# Patient Record
Sex: Female | Born: 1970 | Race: White | Hispanic: Yes | Marital: Single | Smoking: Never smoker
Health system: Southern US, Community
[De-identification: ages and names within clinical notes are randomized; demographics above are authoritative.]

## PROBLEM LIST (undated history)

## (undated) DIAGNOSIS — I10 Essential (primary) hypertension: Secondary | ICD-10-CM

---

## 2012-12-28 ENCOUNTER — Emergency Department: Payer: Self-pay | Admitting: Emergency Medicine

## 2012-12-28 LAB — URINALYSIS, COMPLETE
Bacteria: NONE SEEN
Bilirubin,UR: NEGATIVE
Glucose,UR: NEGATIVE mg/dL (ref 0–75)
Ketone: NEGATIVE
Leukocyte Esterase: NEGATIVE
Nitrite: NEGATIVE
Ph: 5 (ref 4.5–8.0)
Protein: NEGATIVE
RBC,UR: 2 /HPF (ref 0–5)
Specific Gravity: 1.019 (ref 1.003–1.030)
Squamous Epithelial: 12
WBC UR: 1 /HPF (ref 0–5)

## 2014-03-17 ENCOUNTER — Emergency Department: Payer: Self-pay | Admitting: Emergency Medicine

## 2014-03-18 LAB — COMPREHENSIVE METABOLIC PANEL
ALT: 28 U/L
AST: 29 U/L (ref 15–37)
Albumin: 3.7 g/dL (ref 3.4–5.0)
Alkaline Phosphatase: 83 U/L
Anion Gap: 6 — ABNORMAL LOW (ref 7–16)
BUN: 19 mg/dL — ABNORMAL HIGH (ref 7–18)
Bilirubin,Total: 0.3 mg/dL (ref 0.2–1.0)
Calcium, Total: 8.9 mg/dL (ref 8.5–10.1)
Chloride: 99 mmol/L (ref 98–107)
Co2: 34 mmol/L — ABNORMAL HIGH (ref 21–32)
Creatinine: 0.82 mg/dL (ref 0.60–1.30)
EGFR (Non-African Amer.): >60
GLUCOSE: 116 mg/dL — AB (ref 65–99)
OSMOLALITY: 281 (ref 275–301)
POTASSIUM: 3.2 mmol/L — AB (ref 3.5–5.1)
Sodium: 139 mmol/L (ref 136–145)
TOTAL PROTEIN: 7.9 g/dL (ref 6.4–8.2)

## 2014-03-18 LAB — CBC WITH DIFFERENTIAL/PLATELET
BASOS ABS: 0.1 10*3/uL (ref 0.0–0.1)
Basophil %: 0.5 %
EOS PCT: 1.6 %
Eosinophil #: 0.2 10*3/uL (ref 0.0–0.7)
HCT: 38 % (ref 35.0–47.0)
HGB: 12.8 g/dL (ref 12.0–16.0)
Lymphocyte #: 3.7 10*3/uL — ABNORMAL HIGH (ref 1.0–3.6)
Lymphocyte %: 31.7 %
MCH: 29.3 pg (ref 26.0–34.0)
MCHC: 33.8 g/dL (ref 32.0–36.0)
MCV: 87 fL (ref 80–100)
Monocyte #: 1.1 x10 3/mm — ABNORMAL HIGH (ref 0.2–0.9)
Monocyte %: 9.1 %
Neutrophil #: 6.7 10*3/uL — ABNORMAL HIGH (ref 1.4–6.5)
Neutrophil %: 57.1 %
Platelet: 301 10*3/uL (ref 150–440)
RBC: 4.38 10*6/uL (ref 3.80–5.20)
RDW: 13.1 % (ref 11.5–14.5)
WBC: 11.8 10*3/uL — ABNORMAL HIGH (ref 3.6–11.0)

## 2014-03-18 LAB — URINALYSIS, COMPLETE
BILIRUBIN, UR: NEGATIVE
Glucose,UR: NEGATIVE mg/dL (ref 0–75)
Ketone: NEGATIVE
Nitrite: NEGATIVE
PROTEIN: NEGATIVE
Ph: 5 (ref 4.5–8.0)
RBC,UR: 2 /HPF (ref 0–5)
Specific Gravity: 1.02 (ref 1.003–1.030)
Squamous Epithelial: 6
WBC UR: 5 /HPF (ref 0–5)

## 2014-03-18 LAB — TROPONIN I: Troponin-I: <0.02 ng/mL

## 2014-04-10 ENCOUNTER — Emergency Department: Payer: Self-pay | Admitting: Emergency Medicine

## 2014-07-25 ENCOUNTER — Emergency Department
Admit: 2014-07-25 | Disposition: A | Discharge status: Home / Self Care | Payer: Self-pay | Admitting: Emergency Medicine

## 2014-08-04 ENCOUNTER — Emergency Department
Admission: EM | Admit: 2014-08-04 | Discharge: 2014-08-04 | Disposition: A | Discharge status: Home / Self Care | Payer: BLUE CROSS/BLUE SHIELD | Attending: Emergency Medicine | Admitting: Emergency Medicine

## 2014-08-04 DIAGNOSIS — Z4802 Encounter for removal of sutures: Secondary | ICD-10-CM | POA: Diagnosis present

## 2014-08-04 NOTE — ED Provider Notes (Signed)
[facility] Emergency Department Provider Note  ____________________________________________  Time seen: Approximately 3:40 PM  I have reviewed the triage vital signs and the nursing notes.   HISTORY  Chief Complaint Suture / Staple Removal  HPI Ashlee Griffin is a 44 y.o. female returns for suture removal. She has staples in her scalp that in there for 10 days. She denies any difficulty with these. There has been no pain, no fever, no drainage.   History reviewed. No pertinent past medical history.  There are no active problems to display for this patient.   Past Surgical History  Procedure Laterality Date  . Cesarean section      No current outpatient prescriptions on file.  Allergies Review of patient's allergies indicates not on file.  No family history on file.  Social History History  Substance Use Topics  . Smoking status: Never Smoker   . Smokeless tobacco: Not on file  . Alcohol Use: No    Review of Systems Constitutional: No fever/chills .ENT: No sore throat. Respiratory: Denies shortness of breath. Musculoskeletal: Negative for back pain. Skin: Negative for rash. No area of infection Neurological: Negative for headaches, focal weakness or numbness.    ____________________________________________   PHYSICAL EXAM:  VITAL SIGNS: ED Triage Vitals  Enc Vitals Group     BP 08/04/14 1208 159/87 mmHg     Pulse Rate 08/04/14 1208 79     Resp 08/04/14 1208 14     Temp 08/04/14 1208 98.8 F (37.1 C)     Temp Source 08/04/14 1208 Oral     SpO2 08/04/14 1208 99 %     Weight 08/04/14 1212 137 lb (62.143 kg)     Height 08/04/14 1212 5\' 1"  (1.549 m)     Head Cir --      Peak Flow --      Pain Score 08/04/14 1208 0     Pain Loc --      Pain Edu? --      Excl. in GC? --     Constitutional: Alert and oriented. Well appearing and in no acute distress. Eyes: Conjunctivae are normal. PERRL. EOMI. Head: Atraumatic. Nose: No  congestion/rhinnorhea. Neck: No stridor.   Cardiovascular: Normal rate, regular rhythm. Grossly normal heart sounds.  Good peripheral circulation. Respiratory: Normal respiratory effort.  No retractions. Lungs CTAB. Gastrointestinal: Soft and nontender. No distention. No abdominal bruits. No CVA tenderness. Musculoskeletal: No lower extremity tenderness nor edema.  No joint effusions. Neurologic:  Normal speech and language. No gross focal neurologic deficits are appreciated. Speech is normal. No gait instability. Skin:  Skin is warm, dry and intact. Staples area appears to have healed well and there is no signs of infection or redness. Psychiatric: Mood and affect are normal. Speech and behavior are normal.  ____________________________________________   LABS (all labs ordered are listed, but only abnormal results are displayed)  Labs Reviewed - No data to display ____________________________________________  EKG  No____________________________________________  RADIOLOGY  No ____________________________________________   PROCEDURES  Procedure(s) performed: Staples were removed by the RN without any difficulty  Critical Care performed: No  ____________________________________________   INITIAL IMPRESSION / ASSESSMENT AND PLAN / ED COURSE  Pertinent labs & imaging results that were available during my care of the patient were reviewed by me and considered in my medical decision making (see chart for details).  Staples were removed patient was told to return if any problems ____________________________________________   FINAL CLINICAL IMPRESSION(S) / ED DIAGNOSES  Final diagnoses:  Removal of staples      Tommi RumpsRhonda L Summers, PA-C 08/04/14 1545  Myrna Blazeravid Matthew Schaevitz, MD 08/05/14 [phone]

## 2014-08-04 NOTE — ED Notes (Signed)
Pt here for suture removal, denies having any problems. No drainage visible.

## 2014-08-04 NOTE — ED Notes (Signed)
Pt reports to ED w/ c/o staple removal. Pt states the staples were put in 10 days ago at [facility].  Pts staples are on the L side of head

## 2014-11-22 ENCOUNTER — Encounter: Payer: Self-pay | Admitting: Emergency Medicine

## 2014-11-22 ENCOUNTER — Emergency Department
Admission: EM | Admit: 2014-11-22 | Discharge: 2014-11-23 | Disposition: A | Discharge status: Home / Self Care | Payer: BLUE CROSS/BLUE SHIELD | Attending: Emergency Medicine | Admitting: Emergency Medicine

## 2014-11-22 DIAGNOSIS — I10 Essential (primary) hypertension: Secondary | ICD-10-CM | POA: Diagnosis not present

## 2014-11-22 DIAGNOSIS — M542 Cervicalgia: Secondary | ICD-10-CM

## 2014-11-22 HISTORY — DX: Essential (primary) hypertension: I10

## 2014-11-22 NOTE — ED Notes (Signed)
Pt. States around 8 pm this evening pt. States pain to back rt. Side of neck.  Pt. Denies traumatic injury.  Pt. Denies same symptoms in past.

## 2014-11-22 NOTE — ED Notes (Signed)
Pt c/o pain to the right side of her neck that started about 8pm tonight while she was at work; denies injury; did not take any medications for pain prior to arrival

## 2014-11-22 NOTE — ED Notes (Signed)
No swelling or redness to area.

## 2014-11-23 MED ORDER — IBUPROFEN 800 MG PO TABS
800.0000 mg | ORAL_TABLET | Freq: Once | ORAL | Status: AC
  Administered 2014-11-23: 800 mg via ORAL
  Filled 2014-11-23: qty 1

## 2014-11-23 MED ORDER — IBUPROFEN 800 MG PO TABS: 800.0000 mg | ORAL_TABLET | Freq: Three times a day (TID) | ORAL | Status: DC | PRN

## 2014-11-23 MED ORDER — FLUORESCEIN SODIUM 1 MG OP STRP
1.0000 | ORAL_STRIP | Freq: Once | OPHTHALMIC | Status: AC
Start: 2014-11-23 — End: 2014-11-23
  Administered 2014-11-23: 1 via OPHTHALMIC
  Filled 2014-11-23: qty 1

## 2014-11-23 NOTE — Discharge Instructions (Signed)
Please return for worse pain or any new symptoms. Try motrin 800 mg 1 pill 3 x a day for the pain.

## 2014-11-23 NOTE — ED Provider Notes (Signed)
[facility] Emergency Department Provider Note  ____________________________________________  Time seen: Approximately 12:37 AM  I have reviewed the triage vital signs and the nursing notes.   HISTORY  Chief Complaint Neck Pain    HPI Ashlee Griffin is a 44 y.o. 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 Reports is not severe. It is achy. Nothing really seems to make it better or worse and does not radiate anywhere she reports she had some twitching in her eye which has since resolved she rubbed her eye became red.   Past Medical History  Diagnosis Date  . Hypertension     There are no active problems to display for this patient.   Past Surgical History  Procedure Laterality Date  . Cesarean section      Current Outpatient Rx  Name  Route  Sig  Dispense  Refill  . ibuprofen (ADVIL,MOTRIN) 800 MG tablet   Oral   Take 1 tablet (800 mg total) by mouth every 8 (eight) hours as needed.   30 tablet   0     Allergies Review of patient's allergies indicates no known allergies.  History reviewed. No pertinent family history.  Social History Social History  Substance Use Topics  . Smoking status: Never Smoker   . Smokeless tobacco: None  . Alcohol Use: No    Review of Systems Constitutional: No fever/chills Eyes: No visual changes. ENT: No sore throat. Cardiovascular: Denies chest pain. Respiratory: Denies shortness of breath. Gastrointestinal: No abdominal pain.  No nausea, no vomiting.  No diarrhea.  No constipation. Genitourinary: Negative for dysuria. Musculoskeletal: Negative for back pain. Skin: Negative for rash. Neurological: Negative for headaches, focal weakness or numbness.  10-point ROS otherwise negative.  ____________________________________________   PHYSICAL EXAM:  VITAL SIGNS: ED Triage Vitals  Enc Vitals Group     BP 11/22/14 2249 163/97 mmHg     Pulse  Rate 11/22/14 2249 77     Resp 11/22/14 2249 18     Temp 11/22/14 2249 98.8 F (37.1 C)     Temp Source 11/22/14 2249 Oral     SpO2 11/22/14 2249 100 %     Weight 11/22/14 2249 143 lb (64.864 kg)     Height --      Head Cir --      Peak Flow --      Pain Score 11/22/14 2249 8     Pain Loc --      Pain Edu? --      Excl. in GC? --     Constitutional: Alert and oriented. Well appearing and in no acute distress. Eyes: Right conjunctiva is somewhat injected pleurisy and is negative there is no discharge she reports she rubbed her eye is quite became red patient reports she rubbed her eye because twitching which is since resolved. PERRL. EOMI.  Head: Atraumatic. nose: No congestion/rhinnorhea. Mouth/Throat: Mucous membranes are moist.  Oropharynx non-erythematous. Neck: No stridor. No limitation to range of motion and no palpable nodes no muscle spasm no real tenderness on palpation either over the muscle No cervical spine tenderness to palpation. Cardiovascular: Normal rate, regular rhythm. Grossly normal heart sounds.  Good peripheral circulation. Respiratory: Normal respiratory effort.  No retractions. Lungs CTAB. Gastrointestinal: Soft and nontender. No distention. No abdominal bruits. No CVA tenderness. Musculoskeletal: No lower extremity tenderness nor edema.  No joint effusions. Neurologic:  Normal speech and language. No gross focal neurologic deficits  are appreciated. No gait instability. Skin:  Skin is warm, dry and intact. No rash noted. Psychiatric: Mood and affect are normal. Speech and behavior are normal.  ____________________________________________   LABS (all labs ordered are listed, but only abnormal results are displayed)  Labs Reviewed - No data to  display ____________________________________________  EKG   ____________________________________________  RADIOLOGY   ____________________________________________   PROCEDURES    ____________________________________________   INITIAL IMPRESSION / ASSESSMENT AND PLAN / ED COURSE  Pertinent labs & imaging results that were available during my care of the patient were reviewed by me and considered in my medical decision making (see chart for details).   ____________________________________________   FINAL CLINICAL IMPRESSION(S) / ED DIAGNOSES  Final diagnoses:  Neck pain      Arnaldo Natal, MD 11/23/14 [phone]

## 2015-01-16 ENCOUNTER — Emergency Department
Admission: EM | Admit: 2015-01-16 | Discharge: 2015-01-16 | Disposition: A | Discharge status: Home / Self Care | Payer: BLUE CROSS/BLUE SHIELD | Attending: Student | Admitting: Student

## 2015-01-16 ENCOUNTER — Other Ambulatory Visit: Payer: Self-pay

## 2015-01-16 ENCOUNTER — Emergency Department: Payer: BLUE CROSS/BLUE SHIELD

## 2015-01-16 ENCOUNTER — Encounter: Payer: Self-pay | Admitting: Emergency Medicine

## 2015-01-16 DIAGNOSIS — R079 Chest pain, unspecified: Secondary | ICD-10-CM | POA: Diagnosis present

## 2015-01-16 DIAGNOSIS — R1013 Epigastric pain: Secondary | ICD-10-CM | POA: Insufficient documentation

## 2015-01-16 DIAGNOSIS — R070 Pain in throat: Secondary | ICD-10-CM | POA: Diagnosis not present

## 2015-01-16 DIAGNOSIS — I10 Essential (primary) hypertension: Secondary | ICD-10-CM | POA: Insufficient documentation

## 2015-01-16 DIAGNOSIS — Z3202 Encounter for pregnancy test, result negative: Secondary | ICD-10-CM | POA: Diagnosis not present

## 2015-01-16 LAB — COMPREHENSIVE METABOLIC PANEL
ALBUMIN: 4.3 g/dL (ref 3.5–5.0)
ALK PHOS: 65 U/L (ref 38–126)
ALT: 22 U/L (ref 14–54)
AST: 22 U/L (ref 15–41)
Anion gap: 10 (ref 5–15)
BILIRUBIN TOTAL: 0.3 mg/dL (ref 0.3–1.2)
BUN: 11 mg/dL (ref 6–20)
CALCIUM: 9.7 mg/dL (ref 8.9–10.3)
CO2: 27 mmol/L (ref 22–32)
Chloride: 101 mmol/L (ref 101–111)
Creatinine, Ser: 0.72 mg/dL (ref 0.44–1.00)
GFR calc Af Amer: >60 mL/min (ref >60)
GLUCOSE: 119 mg/dL — AB (ref 65–99)
POTASSIUM: 3.6 mmol/L (ref 3.5–5.1)
Sodium: 138 mmol/L (ref 135–145)
TOTAL PROTEIN: 7.8 g/dL (ref 6.5–8.1)

## 2015-01-16 LAB — CBC
HEMATOCRIT: 36.7 % (ref 35.0–47.0)
HEMOGLOBIN: 12.3 g/dL (ref 12.0–16.0)
MCH: 28.6 pg (ref 26.0–34.0)
MCHC: 33.6 g/dL (ref 32.0–36.0)
MCV: 85.1 fL (ref 80.0–100.0)
Platelets: 321 10*3/uL (ref 150–440)
RBC: 4.32 MIL/uL (ref 3.80–5.20)
RDW: 13.2 % (ref 11.5–14.5)
WBC: 9.1 10*3/uL (ref 3.6–11.0)

## 2015-01-16 LAB — HCG, QUANTITATIVE, PREGNANCY: hCG, Beta Chain, Quant, S: 1 m[IU]/mL (ref <5)

## 2015-01-16 LAB — TROPONIN I

## 2015-01-16 MED ORDER — OMEPRAZOLE 20 MG PO CPDR
20.0000 mg | DELAYED_RELEASE_CAPSULE | Freq: Every day | ORAL | Status: AC
Start: 2015-01-16 — End: 2016-01-16

## 2015-01-16 MED ORDER — GI COCKTAIL ~~LOC~~
30.0000 mL | Freq: Once | ORAL | Status: AC
  Administered 2015-01-16: 30 mL via ORAL
  Filled 2015-01-16: qty 30

## 2015-01-16 MED ORDER — ACETAMINOPHEN 500 MG PO TABS
1000.0000 mg | ORAL_TABLET | Freq: Once | ORAL | Status: AC
Start: 2015-01-16 — End: 2015-01-16
  Administered 2015-01-16: 1000 mg via ORAL
  Filled 2015-01-16: qty 2

## 2015-01-16 NOTE — ED Provider Notes (Addendum)
[facility] Emergency Department Provider Note  ____________________________________________  Time seen: Approximately 3:35 PM  I have reviewed the triage vital signs and the nursing notes.   HISTORY  Chief Complaint Chest Pain  History of present illness not limited - she speaks english as well as Spanish and has declined use of interpreter.  HPI Ashlee Griffin is a 44 y.o. female with history of hypertension, no other chronic medical problems who presents for evaluation of nearly 7 hours constant sternal/epigastric pressure, gradual onset, constant since onset, radiating to the back. Not worsened with exertion or movement, not pleuritic in nature. She reports she took some ibuprofen with minimal improvement of her symptoms. Current symptoms are moderate in severity. She has never had pain like this before. She reports 1 week of burning in her throat but no fevers, chills. She has never had similar symptoms. No early family history of coronary artery disease, no personal or family history of PE or DVT.   Past Medical History  Diagnosis Date  . Hypertension     There are no active problems to display for this patient.   Past Surgical History  Procedure Laterality Date  . Cesarean section      Current Outpatient Rx  Name  Route  Sig  Dispense  Refill  . ibuprofen (ADVIL,MOTRIN) 800 MG tablet   Oral   Take 1 tablet (800 mg total) by mouth every 8 (eight) hours as needed.   30 tablet   0     Allergies Review of patient's allergies indicates no known allergies.  No family history on file.  Social History Social History  Substance Use Topics  . Smoking status: Never Smoker   . Smokeless tobacco: None  . Alcohol Use: No    Review of Systems Constitutional: No fever/chills Eyes: No visual changes. ENT: + burning in throat. Cardiovascular: + chest pain. Respiratory: Denies shortness of breath. Gastrointestinal: No abdominal pain.  No nausea,  no vomiting.  No diarrhea.  No constipation. Genitourinary: Negative for dysuria. Musculoskeletal: Negative for back pain. Skin: Negative for rash. Neurological: Negative for headaches, focal weakness or numbness.  10-point ROS otherwise negative.  ____________________________________________   PHYSICAL EXAM:  VITAL SIGNS: ED Triage Vitals  Enc Vitals Group     BP 01/16/15 1251 164/103 mmHg     Pulse Rate 01/16/15 1251 71     Resp 01/16/15 1251 18     Temp 01/16/15 1251 98.1 F (36.7 C)     Temp Source 01/16/15 1251 Oral     SpO2 01/16/15 1251 99 %     Weight 01/16/15 1251 146 lb (66.225 kg)     Height 01/16/15 1251 5' (1.524 m)     Head Cir --      Peak Flow --      Pain Score 01/16/15 1252 7     Pain Loc --      Pain Edu? --      Excl. in GC? --     Constitutional: Alert and oriented. Well appearing and in no acute distress. Eyes: Conjunctivae are normal. PERRL. EOMI. Head: Atraumatic. Nose: No congestion/rhinnorhea. Mouth/Throat: Mucous membranes are moist.  Oropharynx non-erythematous. Neck: No stridor.  Cardiovascular: Normal rate, regular rhythm. Grossly normal heart sounds.  Good peripheral circulation. Respiratory: Normal respiratory effort.  No retractions. Lungs CTAB. Gastrointestinal: Soft and nontender. No distention. No CVA tenderness. Genitourinary: deferred Musculoskeletal: No lower extremity tenderness nor edema.  No joint effusions. Neurologic:  Normal speech and language. No  gross focal neurologic deficits are appreciated. No gait instability. Skin:  Skin is warm, dry and intact. No rash noted. Psychiatric: Mood and affect are normal. Speech and behavior are normal.  ____________________________________________   LABS (all labs ordered are listed, but only abnormal results are displayed)  Labs Reviewed  COMPREHENSIVE METABOLIC PANEL - Abnormal; Notable for the following:    Glucose, Bld 119 (*)    All other components within normal limits   CBC  TROPONIN I  HCG, QUANTITATIVE, PREGNANCY   ____________________________________________  EKG  ED ECG REPORT I, Gayla DossGayle, Junell Cullifer A, the attending physician, personally viewed and interpreted this ECG.   Date: 01/16/2015  EKG Time: 12:48  Rate: 71  Rhythm: normal EKG, normal sinus rhythm  Axis: normal  Intervals:none  ST&T Change: No acute ST elevation  ____________________________________________  RADIOLOGY  CXR  IMPRESSION: No active disease.  ____________________________________________   PROCEDURES  Procedure(s) performed: None  Critical Care performed: No  ____________________________________________   INITIAL IMPRESSION / ASSESSMENT AND PLAN / ED COURSE  Pertinent labs & imaging results that were available during my care of the patient were reviewed by me and considered in my medical decision making (see chart for details).  Ashlee Griffin is a 44 y.o. female with history of hypertension, no other chronic medical problems who presents for evaluation of nearly 7 hours constant sternal/epigastric pressure, gradual onset, constant since onset, radiating to the back. On exam, she is very well-appearing and in no acute distress. Vital signs stable, she is afebrile. She has a benign examination. Labs reviewed. Normal CBC, CMP, negative troponin, she is not pregnant. Chest x-ray clear. EKG reassuring, not consistent with ischemia, troponin negative, pressure not exertional in nature, heart score 1, doubt ACS. PERC negative, doubt PE. Her symptoms have completely resolved after GI cocktail and given the burning in her throat I suspect she may be having symptoms of GERD. We'll start omeprazole. History and physical not consistent with acute aortic dissection. We discussed return precautions and need for close PCP follow-up and she is comfortable with the discharge plan. DC home. ____________________________________________   FINAL CLINICAL IMPRESSION(S) / ED  DIAGNOSES  Final diagnoses:  Chest pain, unspecified chest pain type      Gayla DossEryka A Annaclaire Walsworth, MD 01/16/15 1718  Gayla DossEryka A Spenser Cong, MD 01/16/15 [id number]

## 2015-01-16 NOTE — ED Notes (Signed)
Pt to ED with c/o midsternal cp that radiates through to upper mid back area, states pain started today while at work, denies any other symptoms, states she does however have a slight sore throat

## 2015-08-02 ENCOUNTER — Encounter: Payer: Self-pay | Admitting: Emergency Medicine

## 2015-08-02 ENCOUNTER — Emergency Department
Admission: EM | Admit: 2015-08-02 | Discharge: 2015-08-02 | Disposition: A | Discharge status: Home / Self Care | Payer: BLUE CROSS/BLUE SHIELD | Attending: Emergency Medicine | Admitting: Emergency Medicine

## 2015-08-02 ENCOUNTER — Emergency Department: Payer: BLUE CROSS/BLUE SHIELD

## 2015-08-02 DIAGNOSIS — H9202 Otalgia, left ear: Secondary | ICD-10-CM | POA: Diagnosis present

## 2015-08-02 DIAGNOSIS — J209 Acute bronchitis, unspecified: Secondary | ICD-10-CM | POA: Diagnosis not present

## 2015-08-02 DIAGNOSIS — I10 Essential (primary) hypertension: Secondary | ICD-10-CM | POA: Insufficient documentation

## 2015-08-02 DIAGNOSIS — H66001 Acute suppurative otitis media without spontaneous rupture of ear drum, right ear: Secondary | ICD-10-CM | POA: Insufficient documentation

## 2015-08-02 DIAGNOSIS — J40 Bronchitis, not specified as acute or chronic: Secondary | ICD-10-CM

## 2015-08-02 MED ORDER — ALBUTEROL SULFATE HFA 108 (90 BASE) MCG/ACT IN AERS: 2.0000 | INHALATION_SPRAY | Freq: Four times a day (QID) | RESPIRATORY_TRACT | Status: AC | PRN

## 2015-08-02 MED ORDER — AMOXICILLIN 875 MG PO TABS: 875.0000 mg | ORAL_TABLET | Freq: Two times a day (BID) | ORAL | Status: AC

## 2015-08-02 MED ORDER — PREDNISONE 20 MG PO TABS: 60.0000 mg | ORAL_TABLET | Freq: Every day | ORAL | Status: DC

## 2015-08-02 MED ORDER — IPRATROPIUM-ALBUTEROL 0.5-2.5 (3) MG/3ML IN SOLN
3.0000 mL | Freq: Once | RESPIRATORY_TRACT | Status: AC
  Administered 2015-08-02: 3 mL via RESPIRATORY_TRACT
  Filled 2015-08-02: qty 3

## 2015-08-02 NOTE — ED Notes (Signed)
Pt alert and oriented X4, active, cooperative, pt in NAD. RR even and unlabored, color WNL.  Pt informed to return if any life threatening symptoms occur.  RR even, unlabored, color WNL, pt in NAD.

## 2015-08-02 NOTE — ED Provider Notes (Signed)
[facility] Emergency Department Provider Note   ____________________________________________  Time seen: Approximately 6:33 AM  I have reviewed the triage vital signs and the nursing notes.   HISTORY  Chief Complaint Cough and Otalgia    HPI Ashlee Griffin is a 45 y.o. female comes into the hospital today with a cough and ear pain. The patient reports that she's had a cough for the past 2 weeks. It is dry and sound and she has been taking NyQuil. She reports that she's taken some other medicines to but she is unsure of the names. She reports that the cough is still bothering her. She denies any shortness of breath and reports that her temperature was in the 90s. The patient reports that her boyfriend has been sick. She has some mild left ear pain and feels as though she is unable to hear. Her throat hurts as well. The patient rates her pain a 6-7/10 in intensity. The patient denies any nausea or vomiting denies any blurred vision. She has been eating and drinking well without any difficulty. The patient reports that she does have a headache though. She is here to get her symptoms checked out. According to the triage note the patient has a right-sided earache.   Past Medical History  Diagnosis Date  . Hypertension     There are no active problems to display for this patient.   Past Surgical History  Procedure Laterality Date  . Cesarean section      Current Outpatient Rx  Name  Route  Sig  Dispense  Refill  . albuterol (PROVENTIL HFA;VENTOLIN HFA) 108 (90 Base) MCG/ACT inhaler   Inhalation   Inhale 2 puffs into the lungs every 6 (six) hours as needed.   1 Inhaler   0   . amoxicillin (AMOXIL) 875 MG tablet   Oral   Take 1 tablet (875 mg total) by mouth 2 (two) times daily.   10 tablet   0   . ibuprofen (ADVIL,MOTRIN) 800 MG tablet   Oral   Take 1 tablet (800 mg total) by mouth every 8 (eight) hours as needed.   30 tablet   0   .  omeprazole (PRILOSEC) 20 MG capsule   Oral   Take 1 capsule (20 mg total) by mouth daily.   30 capsule   0   . predniSONE (DELTASONE) 20 MG tablet   Oral   Take 3 tablets (60 mg total) by mouth daily.   12 tablet   0     Allergies Review of patient's allergies indicates no known allergies.  History reviewed. No pertinent family history.  Social History Social History  Substance Use Topics  . Smoking status: Never Smoker   . Smokeless tobacco: None  . Alcohol Use: No    Review of Systems Constitutional: No fever/chills Eyes: No visual changes. ENT:Ear pain and sore throat Cardiovascular: Denies chest pain. Respiratory: Cough Gastrointestinal: No abdominal pain.  No nausea, no vomiting.  No diarrhea.  No constipation. Genitourinary: Negative for dysuria. Musculoskeletal: Negative for back pain. Skin: Negative for rash. Neurological: Negative for headaches, focal weakness or numbness.  10-point ROS otherwise negative.  ____________________________________________   PHYSICAL EXAM:  VITAL SIGNS: ED Triage Vitals  Enc Vitals Group     BP 08/02/15 0619 155/81 mmHg     Pulse Rate 08/02/15 0619 70     Resp 08/02/15 0619 18     Temp 08/02/15 0619 98.3 F (36.8 C)     Temp  Source 08/02/15 0619 Oral     SpO2 08/02/15 0619 99 %     Weight 08/02/15 0619 143 lb (64.864 kg)     Height 08/02/15 0619 5' (1.524 m)     Head Cir --      Peak Flow --      Pain Score 08/02/15 0620 6     Pain Loc --      Pain Edu? --      Excl. in GC? --     Constitutional: Alert and oriented. Well appearing and in no acute distress. Eyes: Conjunctivae are normal. PERRL. EOMI. Ear: right TM with erythema and purulence Head: Atraumatic. Nose: No congestion/rhinnorhea. Mouth/Throat: Mucous membranes are moist.  Oropharynx non-erythematous. Cardiovascular: Normal rate, regular rhythm. Grossly normal heart sounds.  Good peripheral circulation. Respiratory: Normal respiratory effort.  No  retractions. Lungs CTAB. Gastrointestinal: Soft and nontender. No distention. Positive bowel sounds Musculoskeletal: No lower extremity tenderness nor edema.   Neurologic:  Normal speech and language.  Skin:  Skin is warm, dry and intact. Psychiatric: Mood and affect are normal.   ____________________________________________   LABS (all labs ordered are listed, but only abnormal results are displayed)  Labs Reviewed - No data to display ____________________________________________  EKG  none ____________________________________________  RADIOLOGY  CXR: No edema or consolidation ____________________________________________   PROCEDURES  Procedure(s) performed: None  Critical Care performed: No  ____________________________________________   INITIAL IMPRESSION / ASSESSMENT AND PLAN / ED COURSE  Pertinent labs & imaging results that were available during my care of the patient were reviewed by me and considered in my medical decision making (see chart for details).  This is a 45 year old female who comes into the hospital today with some cough and ear pain. I will obtain a chest x-ray to evaluate the patient's cough. She did have some mild wheezing saw give her a DuoNeb treatment. The patient's right ear was red with some purulence. I will treat the patient with some amoxicillin as well for her symptoms.   The patient feels improved. She will be discharged to home.  ____________________________________________   FINAL CLINICAL IMPRESSION(S) / ED DIAGNOSES  Final diagnoses:  Bronchitis  Acute suppurative otitis media of right ear without spontaneous rupture of tympanic membrane, recurrence not specified      NEW MEDICATIONS STARTED DURING THIS VISIT:  New Prescriptions   ALBUTEROL (PROVENTIL HFA;VENTOLIN HFA) 108 (90 BASE) MCG/ACT INHALER    Inhale 2 puffs into the lungs every 6 (six) hours as needed.   AMOXICILLIN (AMOXIL) 875 MG TABLET    Take 1 tablet (875 mg  total) by mouth 2 (two) times daily.   PREDNISONE (DELTASONE) 20 MG TABLET    Take 3 tablets (60 mg total) by mouth daily.     Note:  This document was prepared using Dragon voice recognition software and may include unintentional dictation errors.    Rebecka ApleyAllison P Leighla Chestnutt, MD 08/02/15 [phone]

## 2015-08-02 NOTE — Discharge Instructions (Signed)
Otitis media - Adultos (Otitis Media, Adult) La otitis media es el enrojecimiento, el dolor y la inflamacin del odo [place]. La causa de la otitis media puede ser Vella Raring o, ms frecuentemente, una infeccin. Muchas veces ocurre como una complicacin de un resfro comn. SIGNOS Y SNTOMAS Los sntomas de la otitis media son:  Dolor de odos.  Grant Ruts.  Zumbidos en el odo.  Dolor de [place].  Prdida de lquido por el odo. DIAGNSTICO Para diagnosticar la otitis media, el mdico examinar su odo con un otoscopio. Este instrumento le permite al mdico observar el interior del odo y examinar el tmpano. El mdico le preguntar acerca de sus sntomas. TRATAMIENTO  Generalmente la otitis media mejora sin tratamiento entre 3 y los [address]. El mdico podr recetar algunos medicamentos para Primary school teacher. Si la otitis media no mejora dentro de los [address]  [address] o es recurrente, el mdico puede prescribir antibiticos si sospecha que la causa es una infeccin bacteriana. INSTRUCCIONES PARA EL CUIDADO EN EL HOGAR   Si le recetaron antibiticos, asegrese de terminarlos, incluso si comienza a sentirse mejor.  Tome los medicamentos solamente como se lo haya indicado el mdico.  Concurra a todas las visitas de control como se lo haya indicado el mdico. SOLICITE ATENCIN MDICA SI:  Tiene otitis media solo en un odo o sangra por la nariz, o ambas cosas.  Advierte un bulto en el cuello.  No mejora luego de 3 a 5 das.  Empeora en lugar de mejorar. SOLICITE ATENCIN MDICA DE INMEDIATO SI:   Aumenta el dolor y no puede controlarlo con Tourist information centre manager.  Observa hinchazn, irritacin o dolor alrededor del odo o rigidez en el cuello.  Nota que una parte de su rostro est paralizada.  Nota que el hueso que se encuentra detrs de la oreja (mastoides) le duele al tocarlo. ASEGRESE DE QUE:   Comprende estas instrucciones.  Controlar su afeccin.  Recibir ayuda de inmediato si no  mejora o si empeora.   Esta informacin no tiene Theme park manager el consejo del mdico. Asegrese de hacerle al mdico cualquier pregunta que tenga.   Document Released: 12/22/2004 Document Revised: 04/04/2014 Elsevier Interactive Patient Education 2016 [employer].  Infeccin del tracto respiratorio superior, adultos (Upper Respiratory Infection, Adult) La mayora de las infecciones del tracto respiratorio superior son infecciones virales de las vas que llevan el aire a los pulmones. Un infeccin del tracto respiratorio superior afecta la nariz, la garganta y las vas respiratorias superiores. El tipo ms frecuente de infeccin del tracto respiratorio superior es la nasofaringitis, que habitualmente se conoce como "resfro comn". Las infecciones del tracto respiratorio superior siguen su curso y por lo general se curan solas. En la [employer], la infeccin del tracto respiratorio superior no requiere atencin [place], Biomedical engineer a veces, despus de una infeccin viral, puede surgir una infeccin bacteriana en las vas respiratorias superiores. Esto se conoce como infeccin secundaria. Las infecciones sinusales y en el odo medio son tipos frecuentes de infecciones secundarias en el tracto respiratorio superior. La neumona bacteriana tambin puede complicar un cuadro de infeccin del tracto respiratorio superior. Este tipo de infeccin puede empeorar el asma y la enfermedad pulmonar obstructiva crnica (EPOC). En algunos casos, estas complicaciones pueden requerir atencin mdica de emergencia y poner en peligro la vida.  CAUSAS Casi todas las infecciones del tracto respiratorio superior se deben a los virus. Un virus es un tipo de microbio que puede contagiarse de Neomia Dear persona a Educational psychologist.  FACTORES DE RIESGO Puede estar en riesgo de sufrir una infeccin del tracto respiratorio superior si:   Fuma.  Tiene una enfermedad pulmonar o cardaca crnica.  Tiene debilitado el sistema de defensa  (inmunitario) del cuerpo.  Es [address] Entrancemuy joven o de edad muy Caballoavanzada.  Tiene asma o alergias nasales.  Trabaja en reas donde hay mucha gente o poca ventilacin.  Rudi Cocorabaja en una escuela o en un centro de atencin mdica. SIGNOS Y SNTOMAS  Habitualmente, los sntomas aparecen de 2a 3das despus de entrar en contacto con el virus del resfro. La mayora de las infecciones virales en el tracto respiratorio superior duran de 7a 10das. Sin embargo, las infecciones virales en el tracto respiratorio superior a causa del virus de la gripe pueden durar de 14a 18das y, habitualmente, son ms graves. Entre los sntomas se pueden incluir los siguientes:   Secrecin o congestin nasal.  Estornudos.  Tos.  Dolor de Advertising copywritergarganta.  Dolor de [place].  Fatiga.  Grant RutsFiebre.  Prdida del apetito.  Dolor en la frente, detrs de los ojos y por encima de los pmulos (dolor sinusal).  Dolores musculares. DIAGNSTICO  El mdico puede diagnosticar una infeccin del tracto respiratorio superior mediante los siguientes estudios:  Examen fsico.  Pruebas para verificar si los sntomas no se deben a otra afeccin, por ejemplo:  Faringitis estreptoccica.  Sinusitis.  Neumona.  Asma. TRATAMIENTO  Esta infeccin desaparece sola, con el tiempo. No puede curarse con medicamentos, pero a menudo se prescriben para aliviar los sntomas. Los medicamentos pueden ser tiles para lo siguiente:  Personal assistantBajar la fiebre.  Reducir la tos.  Aliviar la congestin nasal. INSTRUCCIONES PARA EL CUIDADO EN EL HOGAR   Tome los medicamentos solamente como se lo haya indicado el mdico.  A fin de Engineer, materialsaliviar el dolor de garganta, haga grgaras con solucin salina templada o consuma caramelos para la tos, como se lo haya indicado el mdico.  Use un humidificador de vapor clido o inhale el vapor de la ducha para aumentar la humedad del aire. Esto facilitar la respiracin.  Beba suficiente lquido para Photographermantener la orina  clara o de color amarillo plido.  Consuma sopas y otros caldos transparentes, y Abbott Laboratoriesalimntese bien.  Descanse todo lo que sea necesario.  Regrese al Aleen Campitrabajo cuando la temperatura se le haya normalizado o cuando el mdico lo autorice. Es posible que deba quedarse en su casa durante un tiempo prolongado, para no infectar a los dems. Tambin puede usar un barbijo y lavarse las manos con cuidado para Transport plannerevitar la propagacin del virus.  Aumente el uso del inhalador si tiene asma.  No consuma ningn producto que contenga tabaco, lo que incluye cigarrillos, tabaco de Theatre managermascar o Administrator, Civil Servicecigarrillos electrnicos. Si necesita ayuda para dejar de fumar, consulte al American Expressmdico. PREVENCIN  La mejor manera de protegerse de un resfro es mantener una higiene [place]adecuada.   Evite el contacto oral o fsico con personas que tengan sntomas de resfro.  En caso de contacto, lvese las manos con frecuencia. No hay pruebas claras de que la vitaminaC, la vitaminaE, la equincea o el ejercicio reduzcan la probabilidad de Primary school teachercontraer un resfro. Sin embargo, siempre se recomienda Insurance account managerdescansar mucho, hacer ejercicio y Engineering geologistalimentarse bien.  SOLICITE ATENCIN MDICA SI:   Su estado empeora en lugar de mejorar.  Los medicamentos no Estate agentlogran controlar los sntomas.  Tiene escalofros.  La sensacin de falta de aire empeora.  Tiene mucosidad marrn o roja.  Tiene secrecin nasal amarilla o marrn.  Le duele la cara, especialmente al  inclinarse hacia adelante.  Tiene fiebre.  Tiene los ganglios del cuello hinchados.  Siente dolor al tragar.  Tiene zonas blancas en la parte de atrs de la garganta. SOLICITE ATENCIN MDICA DE INMEDIATO SI:   Tiene sntomas intensos o persistentes de:  Dolor de [place].  Dolor de odos.  Dolor sinusal.  Dolor en el pecho.  Tiene enfermedad pulmonar crnica y cualquiera de estos sntomas:  Sibilancias.  Tos prolongada.  Tos con sangre.  Cambio en la mucosidad habitual.  Presenta  rigidez en el cuello.  Tiene cambios en:  La visin.  La audicin.  El pensamiento.  El [place] de nimo. ASEGRESE DE QUE:   Comprende estas instrucciones.  Controlar su afeccin.  Recibir ayuda de inmediato si no mejora o si empeora.   Esta informacin no tiene Theme park manager el consejo del mdico. Asegrese de hacerle al mdico cualquier pregunta que tenga.   Document Released: 12/22/2004 Document Revised: 07/29/2014 Elsevier Interactive Patient Education [employer]

## 2015-08-02 NOTE — ED Notes (Signed)
Pt reports nonproductive cough for 2 weeks; right earache since yesterday; afebrile

## 2015-10-12 ENCOUNTER — Encounter: Payer: Self-pay | Admitting: *Deleted

## 2015-10-12 ENCOUNTER — Emergency Department
Admission: EM | Admit: 2015-10-12 | Discharge: 2015-10-12 | Disposition: A | Discharge status: Home / Self Care | Payer: BLUE CROSS/BLUE SHIELD | Attending: Student | Admitting: Student

## 2015-10-12 ENCOUNTER — Emergency Department: Payer: BLUE CROSS/BLUE SHIELD

## 2015-10-12 DIAGNOSIS — I10 Essential (primary) hypertension: Secondary | ICD-10-CM | POA: Diagnosis not present

## 2015-10-12 DIAGNOSIS — R202 Paresthesia of skin: Secondary | ICD-10-CM | POA: Diagnosis not present

## 2015-10-12 DIAGNOSIS — Z79899 Other long term (current) drug therapy: Secondary | ICD-10-CM | POA: Diagnosis not present

## 2015-10-12 DIAGNOSIS — R079 Chest pain, unspecified: Secondary | ICD-10-CM | POA: Diagnosis not present

## 2015-10-12 DIAGNOSIS — R51 Headache: Secondary | ICD-10-CM | POA: Diagnosis not present

## 2015-10-12 LAB — URINALYSIS COMPLETE WITH MICROSCOPIC (ARMC ONLY)
Bilirubin Urine: NEGATIVE
GLUCOSE, UA: NEGATIVE mg/dL
Hgb urine dipstick: NEGATIVE
KETONES UR: NEGATIVE mg/dL
Leukocytes, UA: NEGATIVE
NITRITE: NEGATIVE
PROTEIN: NEGATIVE mg/dL
SPECIFIC GRAVITY, URINE: 1.006 (ref 1.005–1.030)
pH: 5 (ref 5.0–8.0)

## 2015-10-12 LAB — CBC
HEMATOCRIT: 38.2 % (ref 35.0–47.0)
HEMOGLOBIN: 13 g/dL (ref 12.0–16.0)
MCH: 29 pg (ref 26.0–34.0)
MCHC: 34 g/dL (ref 32.0–36.0)
MCV: 85.1 fL (ref 80.0–100.0)
PLATELETS: 374 10*3/uL (ref 150–440)
RBC: 4.49 MIL/uL (ref 3.80–5.20)
RDW: 13.7 % (ref 11.5–14.5)
WBC: 10.3 10*3/uL (ref 3.6–11.0)

## 2015-10-12 LAB — BASIC METABOLIC PANEL
ANION GAP: 7 (ref 5–15)
BUN: 13 mg/dL (ref 6–20)
CHLORIDE: 102 mmol/L (ref 101–111)
CO2: 26 mmol/L (ref 22–32)
CREATININE: 0.48 mg/dL (ref 0.44–1.00)
Calcium: 9.3 mg/dL (ref 8.9–10.3)
GFR calc non Af Amer: >60 mL/min (ref >60)
Glucose, Bld: 96 mg/dL (ref 65–99)
POTASSIUM: 4.6 mmol/L (ref 3.5–5.1)
SODIUM: 135 mmol/L (ref 135–145)

## 2015-10-12 LAB — TROPONIN I: Troponin I: <0.03 ng/mL (ref <0.03)

## 2015-10-12 LAB — PREGNANCY, URINE: PREG TEST UR: NEGATIVE

## 2015-10-12 MED ORDER — OMEPRAZOLE MAGNESIUM 20 MG PO TBEC
20.0000 mg | DELAYED_RELEASE_TABLET | Freq: Every day | ORAL | Status: AC
Start: 2015-10-12 — End: ?

## 2015-10-12 MED ORDER — ASPIRIN 81 MG PO CHEW
324.0000 mg | CHEWABLE_TABLET | Freq: Once | ORAL | Status: AC
  Administered 2015-10-12: 324 mg via ORAL
  Filled 2015-10-12: qty 4

## 2015-10-12 MED ORDER — GI COCKTAIL ~~LOC~~
30.0000 mL | Freq: Once | ORAL | Status: AC
  Administered 2015-10-12: 30 mL via ORAL
  Filled 2015-10-12: qty 30

## 2015-10-12 MED ORDER — ACETAMINOPHEN 500 MG PO TABS
1000.0000 mg | ORAL_TABLET | Freq: Once | ORAL | Status: AC
  Administered 2015-10-12: 1000 mg via ORAL
  Filled 2015-10-12: qty 2

## 2015-10-12 NOTE — ED Provider Notes (Addendum)
[facility] Emergency Department Provider Note   ____________________________________________  Time seen: Approximately 5:38 PM  I have reviewed the triage vital signs and the nursing notes.   HISTORY  Chief Complaint Headache and Chest Pain  Caveat-Spanish language barrier overcome with use of the interpreter, Jacqui.  HPI Ashlee Griffin is a 45 y.o. female with history of hypertension, no other chronic medical problems who presents for evaluation of resolved left-sided headache and tingling in the left arm today, gradual onset, now resolved, no modifying factors. Patient reports that around 11 AM she developed a left-sided headache which resolved after she took 3 Advil tablets and took a nap. This afternoon at 2 pm, she developed some pressure in the chest that radiated to her back, was not worse with exertion, not ripping or tearing in nature. This has resolved. At that time she also developed 20 minutes of tingling in the left arm which resolved spontaneously. She reports she has had chest pain radiating to the back previously in the setting of "too much acid in my stomach". No vomiting, diarrhea, fevers or chills. Currently she is asymptomatic. No family history of coronary artery disease, no personal or family history of PE, DVT or strokes in young people.    Past Medical History  Diagnosis Date  . Hypertension     There are no active problems to display for this patient.   Past Surgical History  Procedure Laterality Date  . Cesarean section      Current Outpatient Rx  Name  Route  Sig  Dispense  Refill  . albuterol (PROVENTIL HFA;VENTOLIN HFA) 108 (90 Base) MCG/ACT inhaler   Inhalation   Inhale 2 puffs into the lungs every 6 (six) hours as needed.   1 Inhaler   0   . ibuprofen (ADVIL,MOTRIN) 800 MG tablet   Oral   Take 1 tablet (800 mg total) by mouth every 8 (eight) hours as needed.   30 tablet   0   . omeprazole (PRILOSEC OTC) 20 MG  tablet   Oral   Take 1 tablet (20 mg total) by mouth daily.   30 tablet   0   . omeprazole (PRILOSEC) 20 MG capsule   Oral   Take 1 capsule (20 mg total) by mouth daily.   30 capsule   0   . predniSONE (DELTASONE) 20 MG tablet   Oral   Take 3 tablets (60 mg total) by mouth daily.   12 tablet   0     Allergies Review of patient's allergies indicates no known allergies.  History reviewed. No pertinent family history.  Social History Social History  Substance Use Topics  . Smoking status: Never Smoker   . Smokeless tobacco: None  . Alcohol Use: No    Review of Systems Constitutional: No fever/chills Eyes: No visual changes. ENT: No sore throat. Cardiovascular: + chest pain. Respiratory: Denies shortness of breath. Gastrointestinal: No abdominal pain.  No nausea, no vomiting.  No diarrhea.  No constipation. Genitourinary: Negative for dysuria. Musculoskeletal: Negative for back pain. Skin: Negative for rash. Neurological: Positive for headaches, no focal weakness or numbness.  10-point ROS otherwise negative.  ____________________________________________   PHYSICAL EXAM:  Filed Vitals:   10/12/15 1515 10/12/15 1651 10/12/15 1817 10/12/15 1924  BP: 144/84 162/92 147/88 165/99  Pulse: 72 64 61 64  Temp: 98.5 F (36.9 C)     TempSrc: Oral     Resp: 18 18 18 18   Height: 5' (1.524 m)  Weight: 149 lb (67.586 kg)     SpO2: 100% 100% 100% 100%    VITAL SIGNS: ED Triage Vitals  Enc Vitals Group     BP 10/12/15 1515 144/84 mmHg     Pulse Rate 10/12/15 1515 72     Resp 10/12/15 1515 18     Temp 10/12/15 1515 98.5 F (36.9 C)     Temp Source 10/12/15 1515 Oral     SpO2 10/12/15 1515 100 %     Weight 10/12/15 1515 149 lb (67.586 kg)     Height 10/12/15 1515 5' (1.524 m)     Head Cir --      Peak Flow --      Pain Score 10/12/15 1520 7     Pain Loc --      Pain Edu? --      Excl. in GC? --     Constitutional: Alert and oriented. Well appearing and  in no acute distress. Eyes: Conjunctivae are normal. PERRL. EOMI. Head: Atraumatic. Nose: No congestion/rhinnorhea. Mouth/Throat: Mucous membranes are moist.  Oropharynx non-erythematous. Neck: No stridor.  Supple without meningismus. Cardiovascular: Normal rate, regular rhythm. Grossly normal heart sounds.  Good peripheral circulation with symmetric distal pulses. Respiratory: Normal respiratory effort.  No retractions. Lungs CTAB. Gastrointestinal: Soft and nontender. No distention. No CVA tenderness. Genitourinary: deferred Musculoskeletal: No lower extremity tenderness nor edema.  No joint effusions. No calf Swelling asymmetry or tenderness. Neurologic:  Normal speech and language. No gross focal neurologic deficits are appreciated. No gait instability. 5 out of 5 strength bilateral upper and lower extremities, sensation intact to light touch throughout, cranial nerves II through XII intact. Skin:  Skin is warm, dry and intact. No rash noted. Psychiatric: Mood and affect are normal. Speech and behavior are normal.  ____________________________________________   LABS (all labs ordered are listed, but only abnormal results are displayed)  Labs Reviewed  URINALYSIS COMPLETEWITH MICROSCOPIC ([facility] ONLY) - Abnormal; Notable for the following:    Color, Urine STRAW (*)    APPearance HAZY (*)    Bacteria, UA RARE (*)    Squamous Epithelial / LPF 6-30 (*)    All other components within normal limits  BASIC METABOLIC PANEL  CBC  TROPONIN I  PREGNANCY, URINE   ____________________________________________  EKG  ED ECG REPORT I, Gayla Doss, the attending physician, personally viewed and interpreted this ECG.   Date: 10/12/2015  EKG Time: 15:26  Rate: 66  Rhythm: normal EKG, normal sinus rhythm  Axis: normal  Intervals:none  ST&T Change: No acute ST elevation or acute ST depression.  ____________________________________________  RADIOLOGY  CXR  IMPRESSION: No active  cardiopulmonary disease.  CT head IMPRESSION: Normal head CT. No intracranial mass, hemorrhage or edema.   ____________________________________________   PROCEDURES  Procedure(s) performed: None  Procedures  Critical Care performed: No  ____________________________________________   INITIAL IMPRESSION / ASSESSMENT AND PLAN / ED COURSE  Pertinent labs & imaging results that were available during my care of the patient were reviewed by me and considered in my medical decision making (see chart for details).   Ashlee Griffin is a 45 y.o. female with history of hypertension, no other chronic medical problems who presents for evaluation of resolved left-sided headache and tingling in the left arm today as well as resolved chest pressure. On exam, she is very well-appearing and in no acute distress. Her vital signs are stable, she is afebrile. She has an intact neurological examination and I doubt CVA however will  obtain CT head given her complaints. ABCD 2 score is 1 making her very low risk for major ischemic event and her nonspecific tingling in the left arm has resolved. EKG is reassuring, score is 1, troponin negative (and drawn 3 hours after symptom onset), I doubt that this represents ACS. PERC negative and I doubt PE. She has had similar pain in the setting of what sounds like GERD and I doubt that this represents acute aortic dissection. We'll treat with GI cocktail, give aspirin, obtain screening labs and chest x-ray. Reassess for disposition.  ----------------------------------------- 8:25 PM on 10/12/2015 ----------------------------------------- CBC, the MB, troponin unremarkable. Upper disease as read urinalysis are consistent with infection. CT head shows no acute intracranial process. Chest x-ray shows no acute cardiopulmonary disease. Patient has had complete resolution of symptoms since arrival to the emergency department, no recurrence. Nonspecific chest pain which may  well be related to GERD given history of same. We discussed return precautions, need for close PCP follow-up on my compliance with omeprazole and she is comfortable with the discharge plan. DC home.  ____________________________________________   FINAL CLINICAL IMPRESSION(S) / ED DIAGNOSES  Final diagnoses:  Chest pain, unspecified chest pain type  Paresthesia      NEW MEDICATIONS STARTED DURING THIS VISIT:  New Prescriptions   OMEPRAZOLE (PRILOSEC OTC) 20 MG TABLET    Take 1 tablet (20 mg total) by mouth daily.     Note:  This document was prepared using Dragon voice recognition software and may include unintentional dictation errors.    Gayla Doss, MD 10/12/15 1610  Gayla Doss, MD 10/12/15 9604  Gayla Doss, MD 10/12/15 2031

## 2015-10-12 NOTE — ED Notes (Signed)
Interpreter Jacqui at bedside. 

## 2015-10-12 NOTE — ED Notes (Addendum)
Per interpreter [phone], pt states left sided headache and chest pain and right hand tingling, states symptoms began yesterday, states nausea, states SOB with chest pain and radiation to back

## 2017-07-12 IMAGING — CR DG CHEST 2V
1 series · 2 of 2 positions shown · non-contrast
Comparison: Chest x-ray dated 08/02/2015.

CLINICAL DATA: Midsternal chest pain today radiating to right-sided
chest and to left side of back.

EXAM:
CHEST  2 VIEW

[Series 1: dg chest 2 view · 0.14mm/px · 2 of 2 slices shown]
[im 1/2]
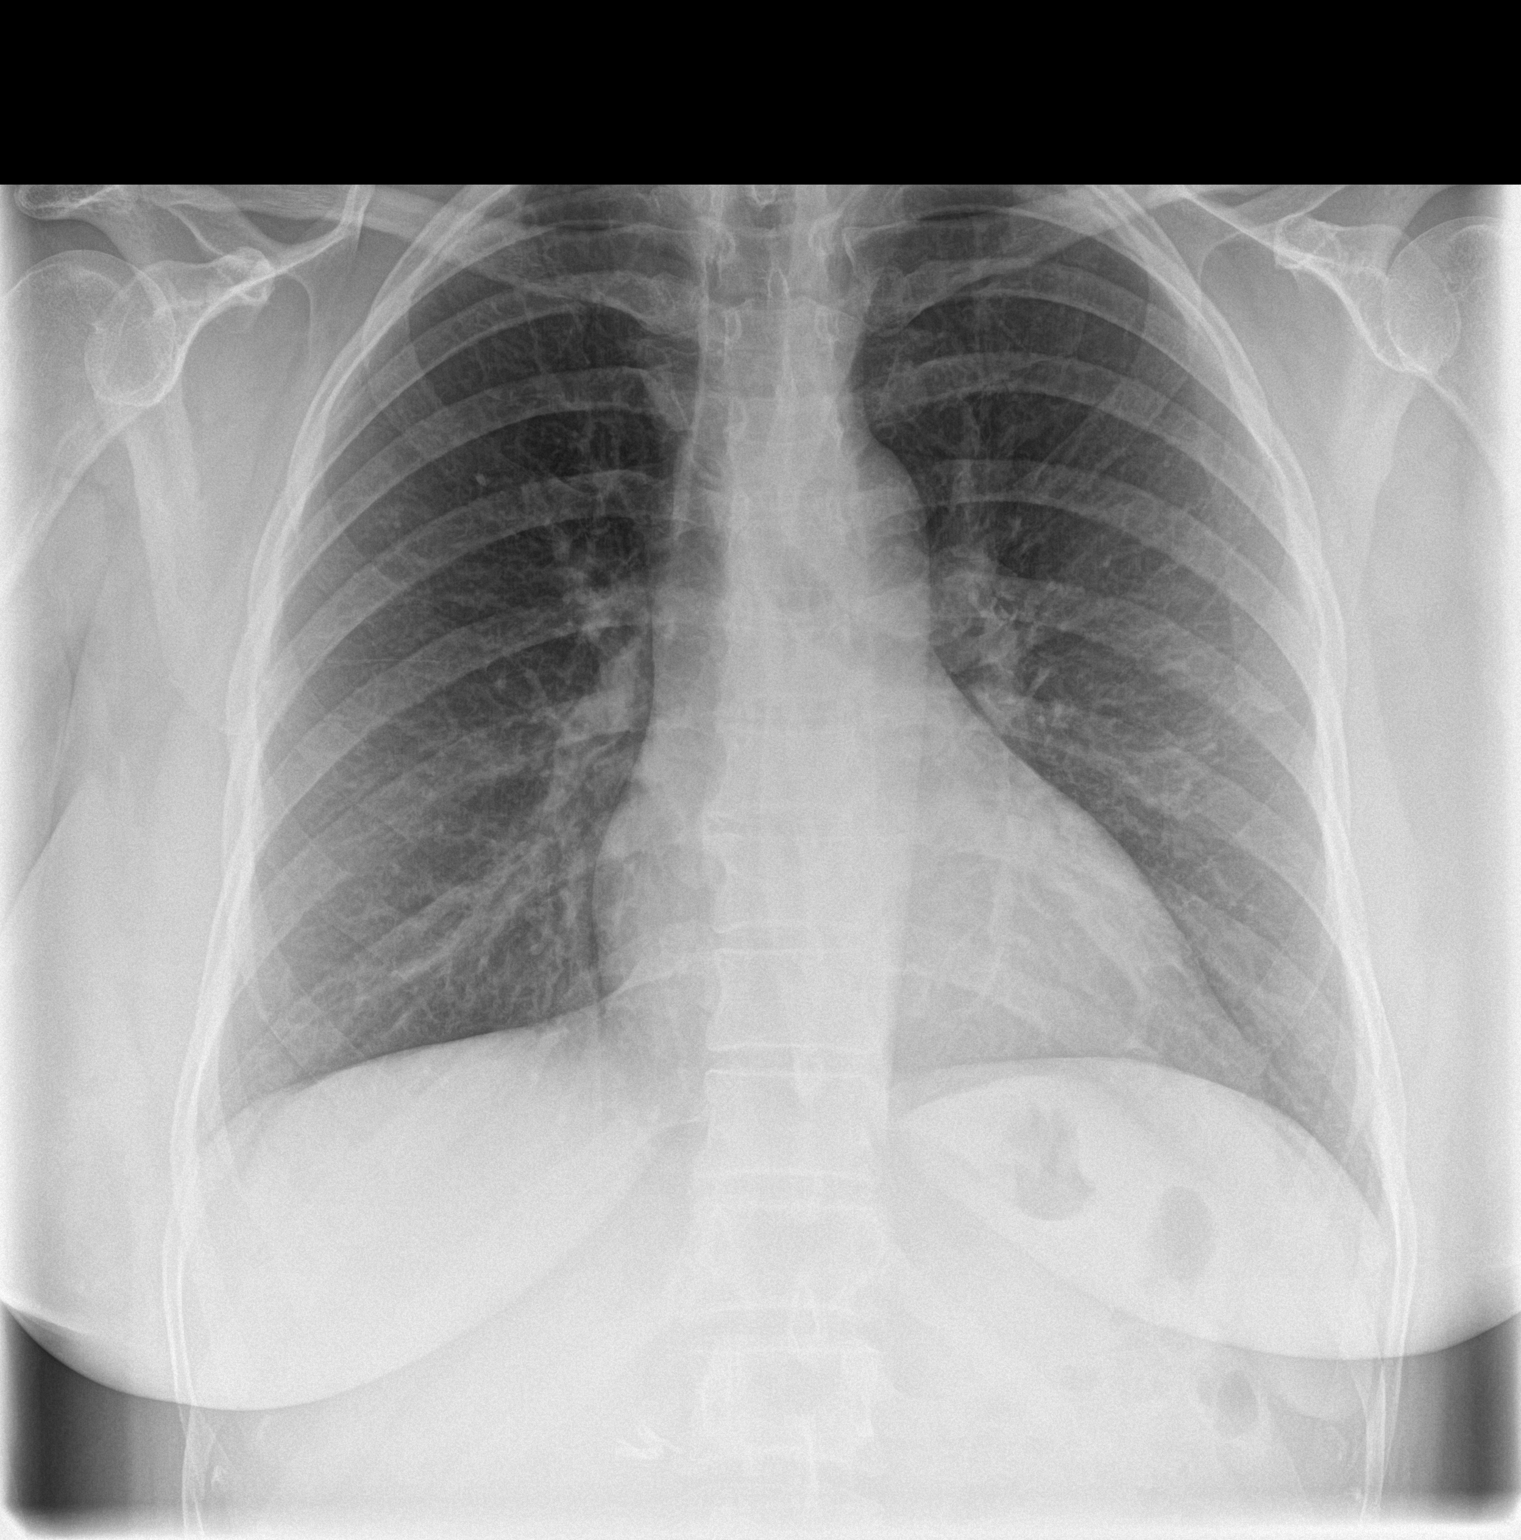
[im 2/2]
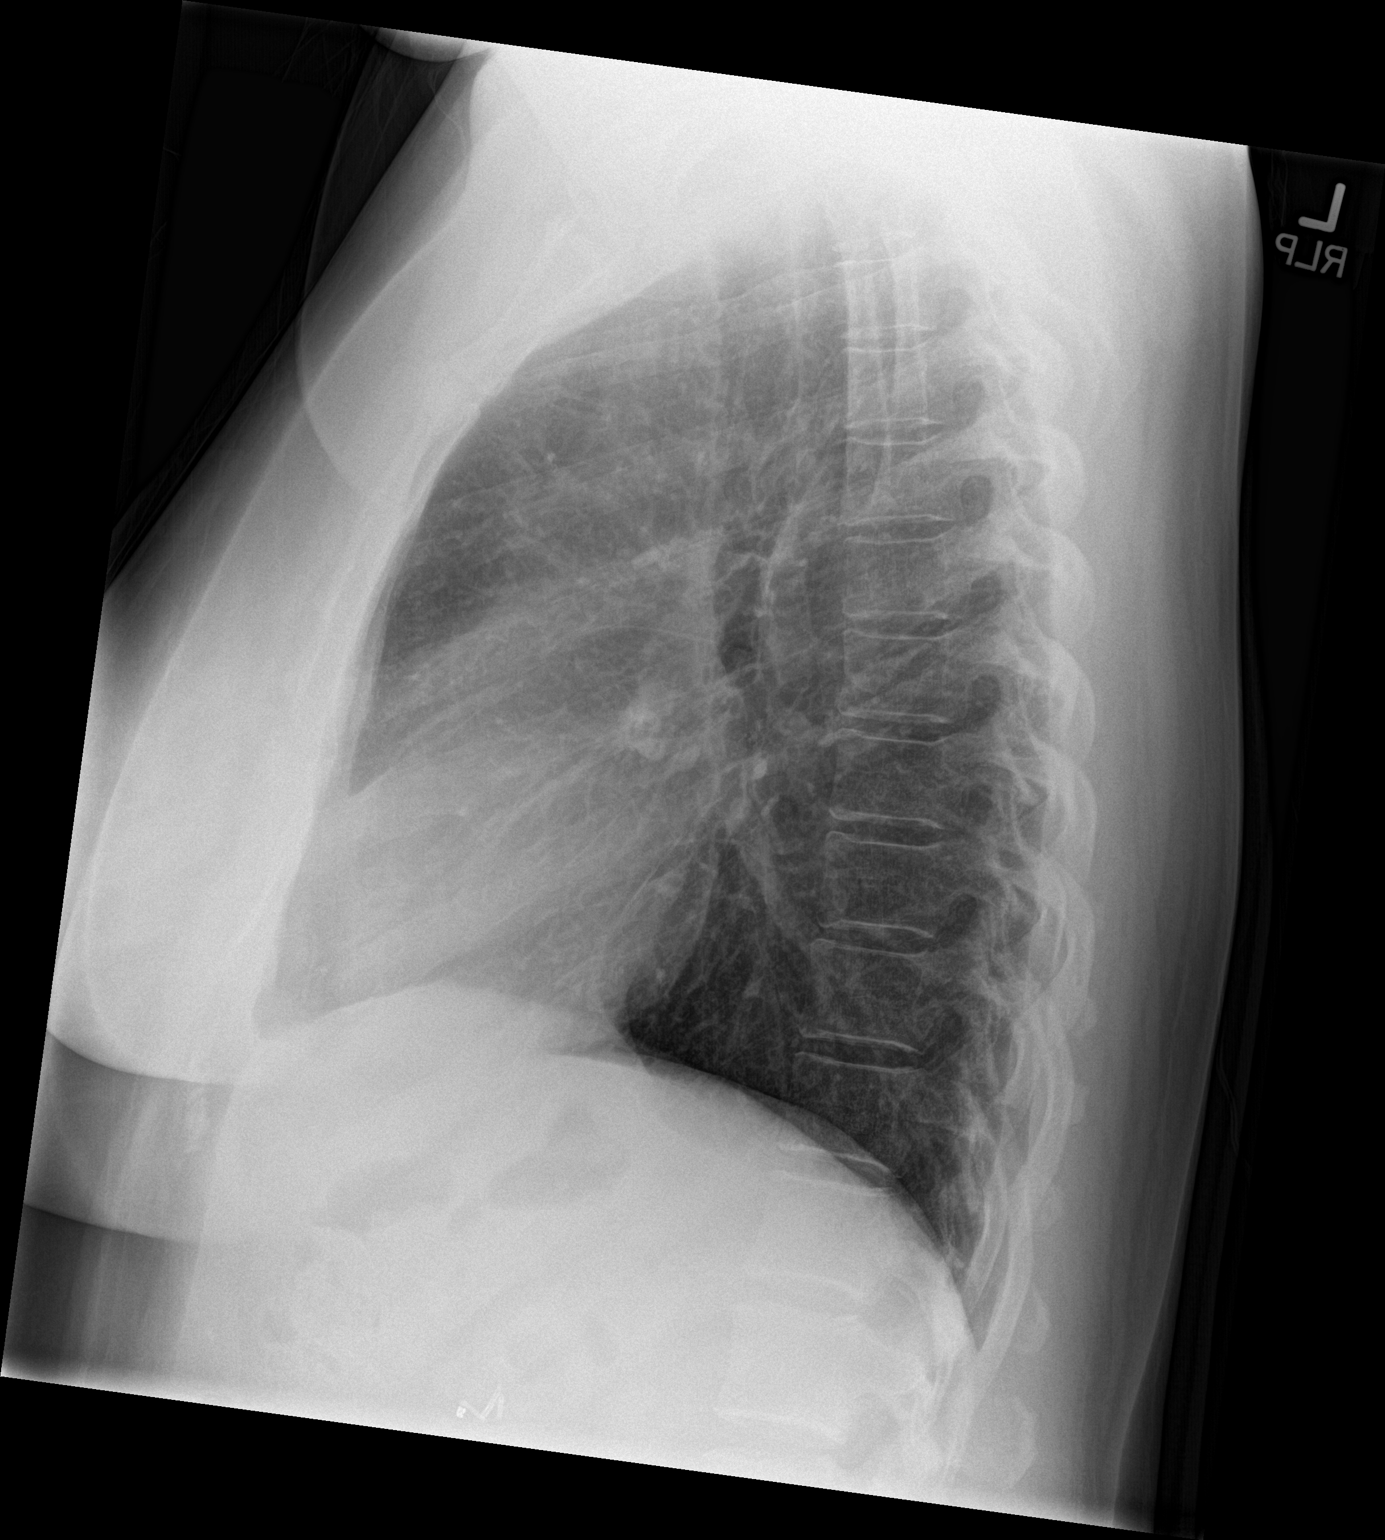

[2 of 2 positions shown; findings below may reference images not displayed]

FINDINGS: Cardiomediastinal silhouette is normal in size and configuration.
Lungs are clear. Lung volumes are normal. No evidence of pneumonia.
No pleural effusion. No pneumothorax.

Osseous and soft tissue structures about the chest are unremarkable.
Cholecystectomy clips noted in the right upper quadrant.
IMPRESSION: No active cardiopulmonary disease.

## 2017-07-12 IMAGING — CT CT HEAD W/O CM
3 series · 16 of 47 positions shown, 19 images · non-contrast
Comparison: Head CT dated 07/25/2014.

CLINICAL DATA: Left-sided headache and chest pain, right hand
tingling, symptoms onset yesterday.

EXAM:
CT HEAD WITHOUT CONTRAST
TECHNIQUE: Contiguous axial images were obtained from the base of the skull
through the vertex without intravenous contrast.

[Series 2: head wo · axial · 0.38mm/px · z∈[+325,+450]mm · 10 of 31 slices shown, 13 images]
[im 3/31  brain]
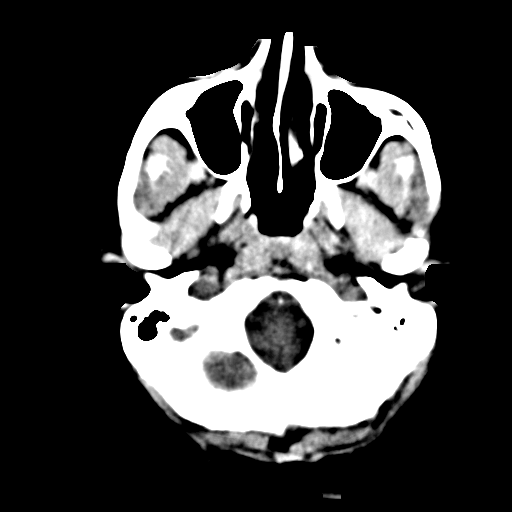
[im 3/31  bone]
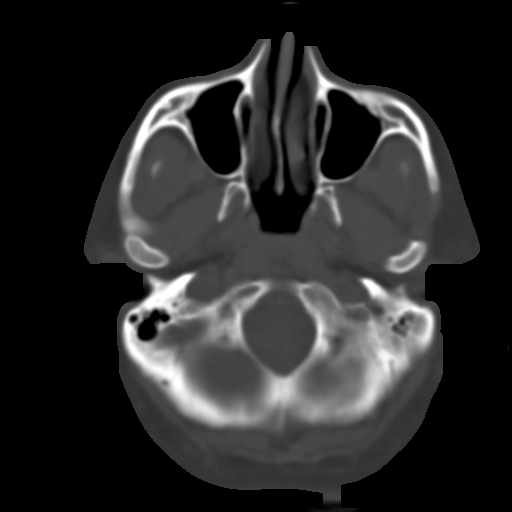
[im 6/31  brain]
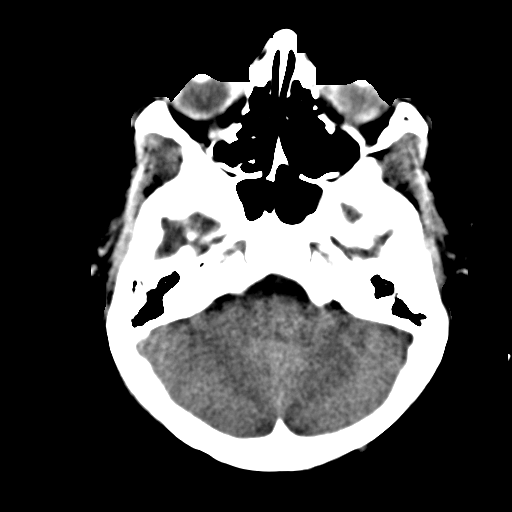
[im 9/31  brain]
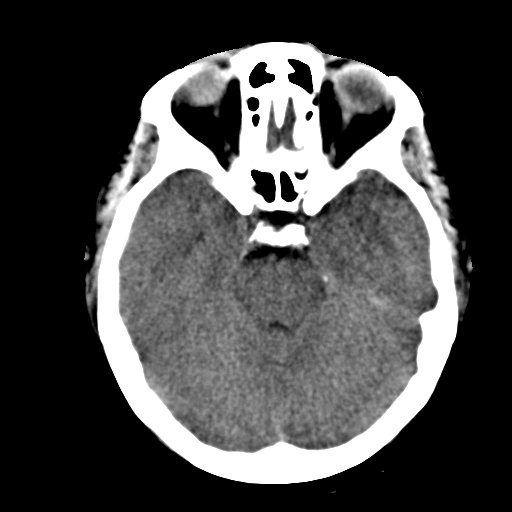
[im 11/31  brain]
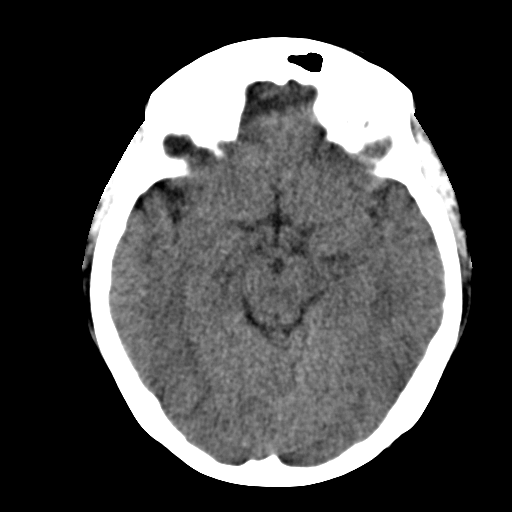
[im 14/31  brain]
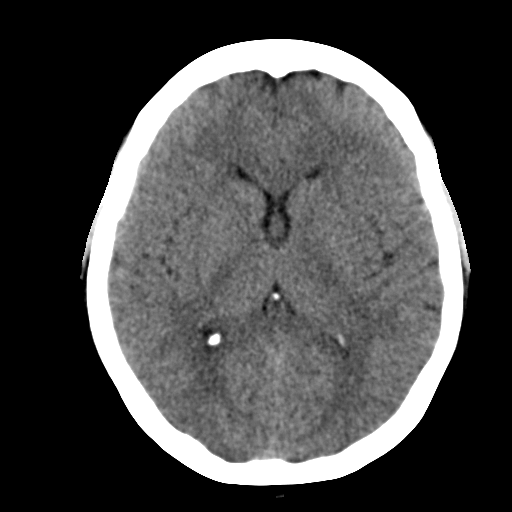
[im 14/31  bone]
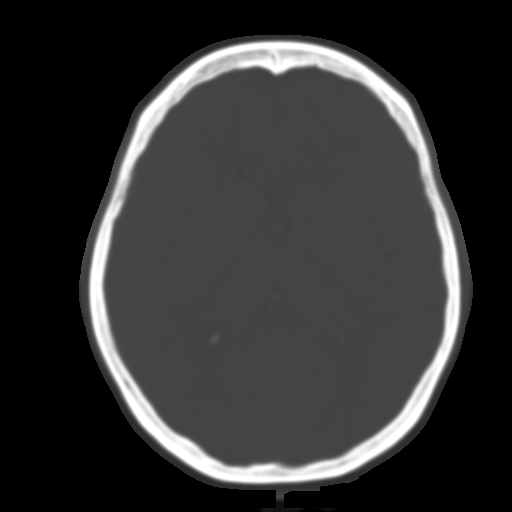
[im 17/31  brain]
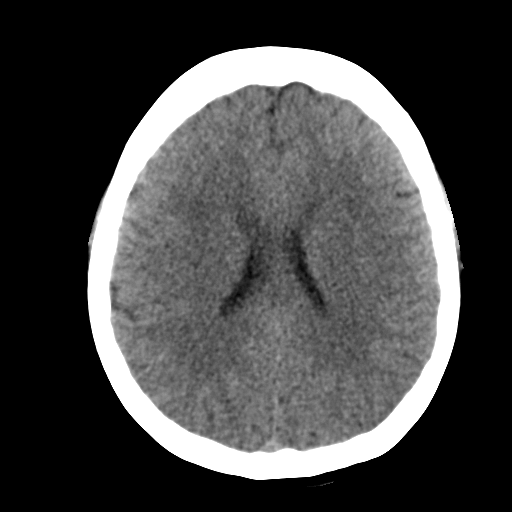
[im 20/31  brain]
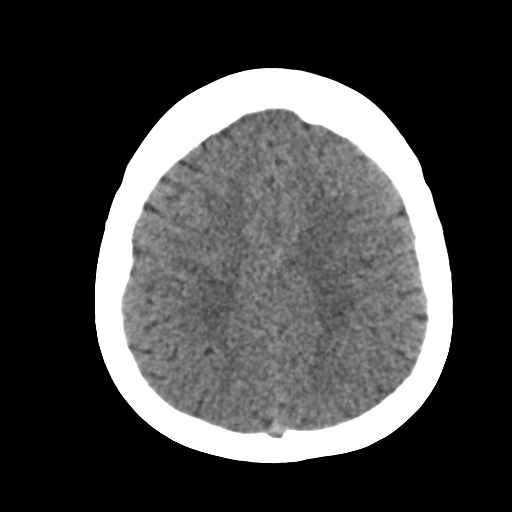
[im 23/31  brain]
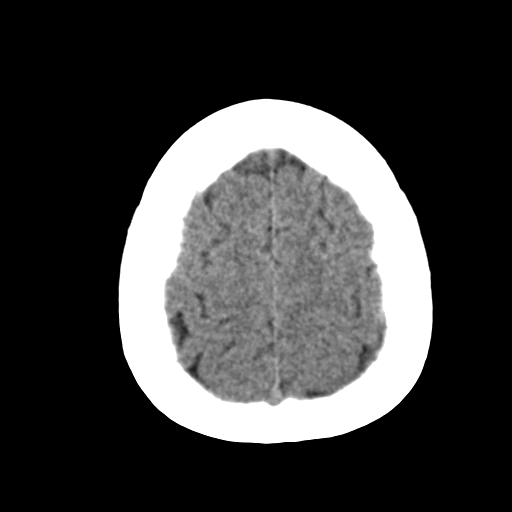
[im 25/31  brain]
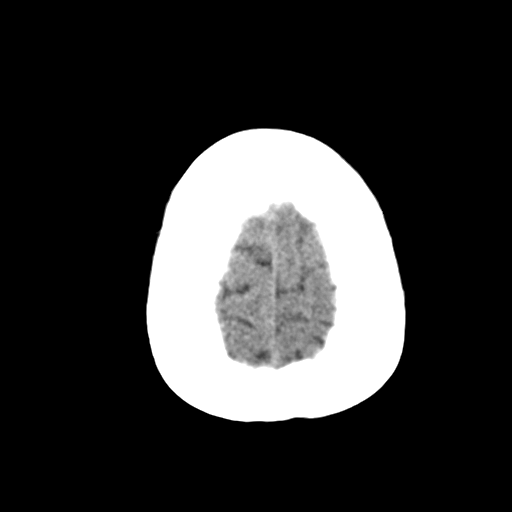
[im 25/31  bone]
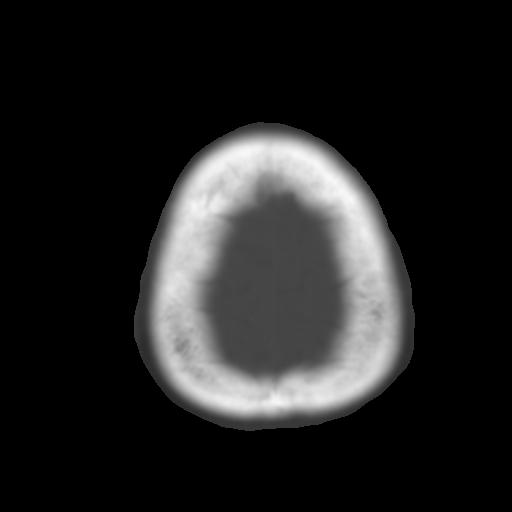
[im 28/31  brain]
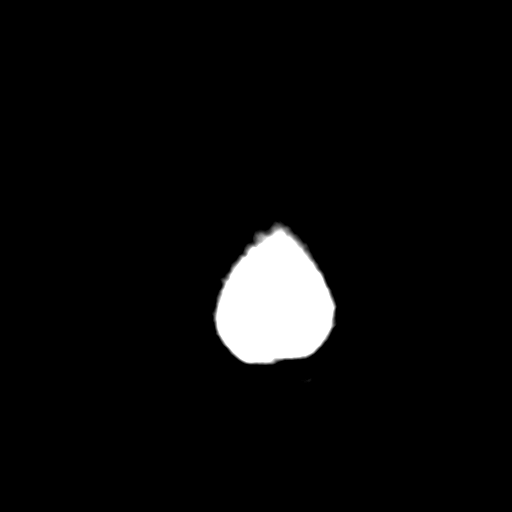

[Series 4: coronal soft · coronal · 0.28mm/px · 3 of 60 slices shown]
[im 20/60  brain]
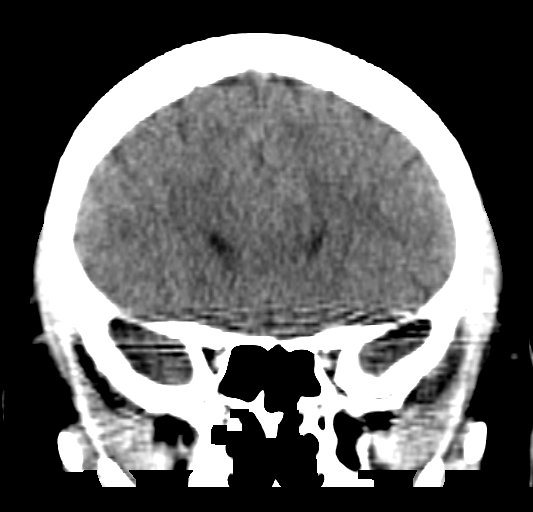
[im 27/60  brain]
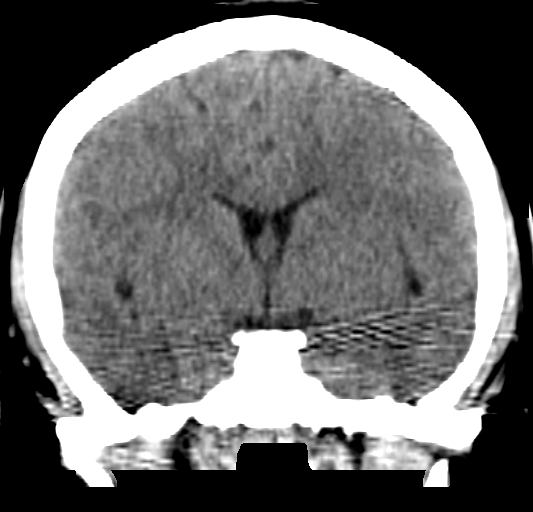
[im 33/60  brain]
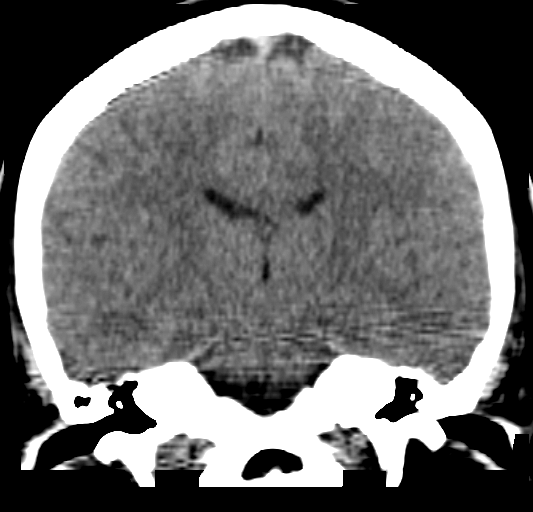

[Series 5: sagittal soft · sagittal · 0.28mm/px · 3 of 52 slices shown]
[im 18/52  brain]
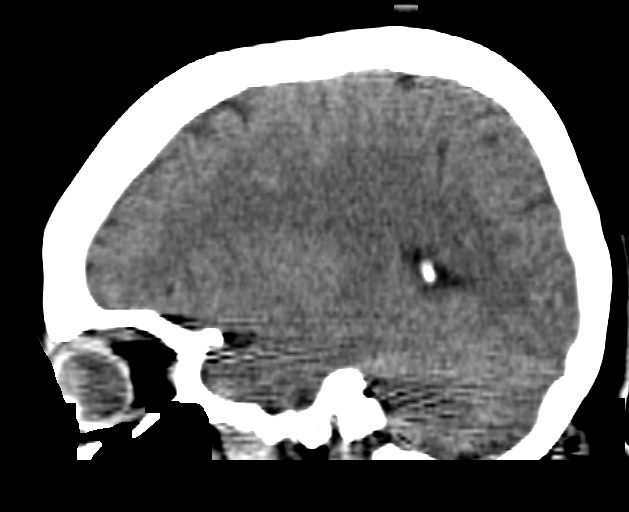
[im 26/52  brain]
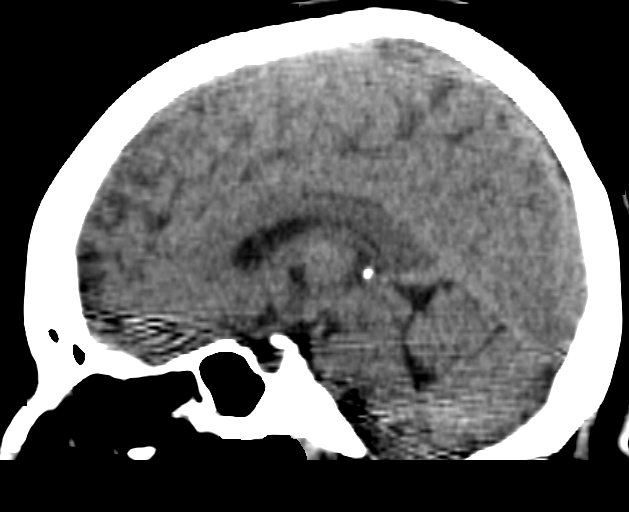
[im 35/52  brain]
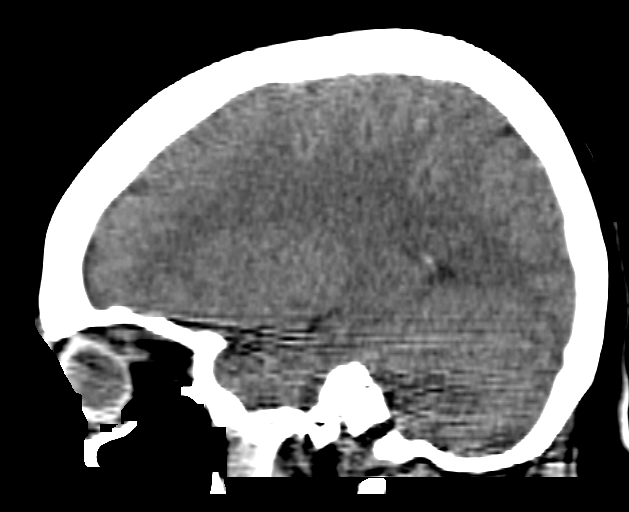

[16 of 47 positions shown; findings below may reference images not displayed]

FINDINGS: Ventricles are normal in size and configuration. All areas of the
brain demonstrate normal gray-white matter attenuation. There is no
mass, hemorrhage, edema or other evidence of acute parenchymal
abnormality. No extra-axial hemorrhage.

No osseous abnormality. Visualized upper paranasal sinuses are
clear. Mastoid air cells are clear.
IMPRESSION: Normal head CT.  No intracranial mass, hemorrhage or edema.

## 2018-12-17 ENCOUNTER — Other Ambulatory Visit: Payer: Self-pay

## 2018-12-17 DIAGNOSIS — Z20822 Contact with and (suspected) exposure to covid-19: Secondary | ICD-10-CM

## 2018-12-19 LAB — NOVEL CORONAVIRUS, NAA: SARS-CoV-2, NAA: DETECTED — AB

## 2019-06-21 ENCOUNTER — Ambulatory Visit: Payer: Self-pay | Attending: Internal Medicine

## 2019-06-21 DIAGNOSIS — Z23 Encounter for immunization: Secondary | ICD-10-CM

## 2019-06-21 NOTE — Progress Notes (Signed)
   Covid-19 Vaccination Clinic  Name:  Ashlee Griffin    MRN: [id number] DOB: 29-Jun-1970  06/21/2019  Ashlee Griffin was observed post Covid-19 immunization for 15 minutes without incident. Ashlee Griffin was provided with Vaccine Information Sheet and instruction to access the V-Safe system.   Ashlee Griffin was instructed to call 911 with any severe reactions post vaccine: Marland Kitchen Difficulty breathing  . Swelling of face and throat  . A fast heartbeat  . A bad rash all over body  . Dizziness and weakness   Immunizations Administered    Name Date Dose VIS Date Route   Pfizer COVID-19 Vaccine 06/21/2019  5:11 PM 0.3 mL 03/08/2019 Intramuscular   Manufacturer: [employer], [employer]   Lot: [id number]   NDC: [address]-8

## 2019-07-12 ENCOUNTER — Ambulatory Visit: Payer: Self-pay | Attending: Internal Medicine

## 2019-07-12 DIAGNOSIS — Z23 Encounter for immunization: Secondary | ICD-10-CM

## 2019-07-12 NOTE — Progress Notes (Signed)
   Covid-19 Vaccination Clinic  Name:  Paislie Tessler    MRN: [id number] DOB: 06-06-1970  07/12/2019  Ms. Ackley was observed post Covid-19 immunization for 15 minutes without incident. She was provided with Vaccine Information Sheet and instruction to access the V-Safe system.   Ms. Lowrimore was instructed to call 911 with any severe reactions post vaccine: Marland Kitchen Difficulty breathing  . Swelling of face and throat  . A fast heartbeat  . A bad rash all over body  . Dizziness and weakness   Immunizations Administered    Name Date Dose VIS Date Route   Pfizer COVID-19 Vaccine 07/12/2019  5:55 PM 0.3 mL 03/08/2019 Intramuscular   Manufacturer: [employer], [employer]   Lot: [id number]   NDC: [address]-1

## 2020-06-05 ENCOUNTER — Emergency Department
Admission: EM | Admit: 2020-06-05 | Discharge: 2020-06-05 | Disposition: A | Discharge status: Home / Self Care | Payer: Self-pay | Attending: Emergency Medicine | Admitting: Emergency Medicine

## 2020-06-05 ENCOUNTER — Emergency Department: Payer: Self-pay

## 2020-06-05 ENCOUNTER — Other Ambulatory Visit: Payer: Self-pay

## 2020-06-05 DIAGNOSIS — I10 Essential (primary) hypertension: Secondary | ICD-10-CM | POA: Insufficient documentation

## 2020-06-05 DIAGNOSIS — R0789 Other chest pain: Secondary | ICD-10-CM

## 2020-06-05 DIAGNOSIS — R072 Precordial pain: Secondary | ICD-10-CM | POA: Insufficient documentation

## 2020-06-05 LAB — CBC
HCT: 37.4 % (ref 36.0–46.0)
Hemoglobin: 12.4 g/dL (ref 12.0–15.0)
MCH: 28.8 pg (ref 26.0–34.0)
MCHC: 33.2 g/dL (ref 30.0–36.0)
MCV: 86.8 fL (ref 80.0–100.0)
Platelets: 395 10*3/uL (ref 150–400)
RBC: 4.31 MIL/uL (ref 3.87–5.11)
RDW: 12.3 % (ref 11.5–15.5)
WBC: 12.1 10*3/uL — ABNORMAL HIGH (ref 4.0–10.5)
nRBC: 0 % (ref 0.0–0.2)

## 2020-06-05 LAB — BASIC METABOLIC PANEL
Anion gap: 9 (ref 5–15)
BUN: 17 mg/dL (ref 6–20)
CO2: 26 mmol/L (ref 22–32)
Calcium: 9.3 mg/dL (ref 8.9–10.3)
Chloride: 104 mmol/L (ref 98–111)
Creatinine, Ser: 0.52 mg/dL (ref 0.44–1.00)
GFR, Estimated: >60 mL/min (ref >60)
Glucose, Bld: 166 mg/dL — ABNORMAL HIGH (ref 70–99)
Potassium: 3.7 mmol/L (ref 3.5–5.1)
Sodium: 139 mmol/L (ref 135–145)

## 2020-06-05 LAB — TROPONIN I (HIGH SENSITIVITY)
Troponin I (High Sensitivity): 4 ng/L (ref <18)
Troponin I (High Sensitivity): 5 ng/L (ref <18)

## 2020-06-05 MED ORDER — LIDOCAINE VISCOUS HCL 2 % MT SOLN
15.0000 mL | Freq: Once | OROMUCOSAL | Status: AC
  Administered 2020-06-05: 15 mL via ORAL
  Filled 2020-06-05: qty 15

## 2020-06-05 MED ORDER — ALUM & MAG HYDROXIDE-SIMETH 200-200-20 MG/5ML PO SUSP
30.0000 mL | Freq: Once | ORAL | Status: AC
Start: 2020-06-05 — End: 2020-06-05
  Administered 2020-06-05: 30 mL via ORAL
  Filled 2020-06-05: qty 30

## 2020-06-05 NOTE — ED Notes (Signed)
Patient provided with discharge instructions and OTC medication instructions. Patient verbalized understanding. Patient denies questions, needs or concerns. Patient alert, oriented x4. Patient respirations even and unlabored. NADN.

## 2020-06-05 NOTE — Discharge Instructions (Addendum)
Take one of the following daily for the next 14 days to help with your reflux. These can be purchased OVER THE COUNTER and generic versions are OKAY.  Omeprazole once daily Lansoprazole once daily Esomeprazole once daily  You can also try: Famotidine (Pepcid) once daily  Take Tums as needed

## 2020-06-05 NOTE — ED Notes (Signed)
See triage note. Patient reports chest pain and SOB. Patient reports pain that radiates down the back. Patient reports hx of similar symptoms that were relieved by OTC medications. Patient reports improvement to chest pain at this time.

## 2020-06-05 NOTE — ED Notes (Signed)
ED Provider at bedside. 

## 2020-06-05 NOTE — ED Notes (Signed)
Patient is resting comfortably. Patient updated on POC.  

## 2020-06-05 NOTE — ED Triage Notes (Addendum)
Pt to ER via POV with complaints of chest pressure x2 days that radiates into neck and down her back. Intermittent shortness of breath associated with exertion. Denies injury. Reports this may have happened a long time ago but it relieved itself. Hx of HTN but no longer prescribed medication for this. Pt skin warm and dry, NAD at this time.

## 2020-06-05 NOTE — ED Provider Notes (Addendum)
[facility] Emergency Department Provider Note  ____________________________________________   Event Date/Time   First MD Initiated Contact with Patient 06/05/20 1919     (approximate)  I have reviewed the triage vital signs and the nursing notes.   HISTORY  Chief Complaint Chest Pain    HPI Ashlee Griffin is a 50 y.o. female  Here with chest pain. Pt reports that for the past 24 hours, she has had off and on aching, burning, substernal chest pressure. She states the pain radiates up toward her throat intermittently. No specific alleviating factors. No change with eating or with exertion. No cough, SOB. No diaphoresis. No h/o CAD. She does report a h/o reflux/heartburn that feels similar though this is slightly worse. No recent med changes.        Past Medical History:  Diagnosis Date  . Hypertension     There are no problems to display for this patient.   Past Surgical History:  Procedure Laterality Date  . CESAREAN SECTION      Prior to Admission medications   Medication Sig Start Date End Date Taking? Authorizing Provider  albuterol (PROVENTIL HFA;VENTOLIN HFA) 108 (90 Base) MCG/ACT inhaler Inhale 2 puffs into the lungs every 6 (six) hours as needed. 08/02/15   Rebecka Apley, MD  ibuprofen (ADVIL,MOTRIN) 800 MG tablet Take 1 tablet (800 mg total) by mouth every 8 (eight) hours as needed. 11/23/14   Arnaldo Natal, MD  omeprazole (PRILOSEC OTC) 20 MG tablet Take 1 tablet (20 mg total) by mouth daily. 10/12/15   Gayla Doss, MD  omeprazole (PRILOSEC) 20 MG capsule Take 1 capsule (20 mg total) by mouth daily. 01/16/15 01/16/16  Gayla Doss, MD  predniSONE (DELTASONE) 20 MG tablet Take 3 tablets (60 mg total) by mouth daily. 08/02/15   Rebecka Apley, MD    Allergies Patient has no known allergies.  No family history on file.  Social History Social History   Tobacco Use  . Smoking status: Never Smoker  . Smokeless  tobacco: Never Used  Substance Use Topics  . Alcohol use: No  . Drug use: No    Review of Systems  Review of Systems  Constitutional: Negative for chills and fever.  HENT: Negative for sore throat.   Respiratory: Negative for shortness of breath.   Cardiovascular: Positive for chest pain.  Gastrointestinal: Negative for abdominal pain.  Genitourinary: Negative for flank pain.  Musculoskeletal: Negative for neck pain.  Skin: Negative for rash and wound.  Allergic/Immunologic: Negative for immunocompromised state.  Neurological: Negative for weakness and numbness.  Hematological: Does not bruise/bleed easily.  All other systems reviewed and are negative.    ____________________________________________  PHYSICAL EXAM:      VITAL SIGNS: ED Triage Vitals  Enc Vitals Group     BP 06/05/20 1753 (!) 165/100     Pulse Rate 06/05/20 1753 73     Resp 06/05/20 1753 16     Temp 06/05/20 1753 98.4 F (36.9 C)     Temp Source 06/05/20 1753 Oral     SpO2 06/05/20 1753 99 %     Weight 06/05/20 1749 142 lb (64.4 kg)     Height 06/05/20 1749 5' (1.524 m)     Head Circumference --      Peak Flow --      Pain Score 06/05/20 1748 7     Pain Loc --      Pain Edu? --  Excl. in GC? --      Physical Exam Vitals and nursing note reviewed.  Constitutional:      General: She is not in acute distress.    Appearance: She is well-developed.  HENT:     Head: Normocephalic and atraumatic.  Eyes:     Conjunctiva/sclera: Conjunctivae normal.  Cardiovascular:     Rate and Rhythm: Normal rate and regular rhythm.     Heart sounds: Normal heart sounds. No murmur heard. No friction rub.  Pulmonary:     Effort: Pulmonary effort is normal. No respiratory distress.     Breath sounds: Normal breath sounds. No wheezing or rales.  Abdominal:     General: There is no distension.     Palpations: Abdomen is soft.     Tenderness: There is no abdominal tenderness.  Musculoskeletal:     Cervical  back: Neck supple.  Skin:    General: Skin is warm.     Capillary Refill: Capillary refill takes less than 2 seconds.  Neurological:     Mental Status: She is alert and oriented to person, place, and time.     Motor: No abnormal muscle tone.       ____________________________________________   LABS (all labs ordered are listed, but only abnormal results are displayed)  Labs Reviewed  BASIC METABOLIC PANEL - Abnormal; Notable for the following components:      Result Value   Glucose, Bld 166 (*)    All other components within normal limits  CBC - Abnormal; Notable for the following components:   WBC 12.1 (*)    All other components within normal limits  TROPONIN I (HIGH SENSITIVITY)  TROPONIN I (HIGH SENSITIVITY)    ____________________________________________  EKG: Normal sinus rhythm, VR 76. PR 142, QRS 88, QTc 407. No acute St elevation or depression. ________________________________________  RADIOLOGY All imaging, including plain films, CT scans, and ultrasounds, independently reviewed by me, and interpretations confirmed via formal radiology reads.  ED MD interpretation:   CXR: Normal  Official radiology report(s): DG Chest 2 View  Result Date: 06/05/2020 CLINICAL DATA:  Chest pain EXAM: CHEST - 2 VIEW COMPARISON:  10/12/2015 FINDINGS: The heart size and mediastinal contours are within normal limits. Both lungs are clear. The visualized skeletal structures are unremarkable. IMPRESSION: No acute abnormality of the lungs. Electronically Signed   By: Lauralyn Primes M.D.   On: 06/05/2020 18:38    ____________________________________________  PROCEDURES   Procedure(s) performed (including Critical Care):  Procedures  ____________________________________________  INITIAL IMPRESSION / MDM / ASSESSMENT AND PLAN / ED COURSE  As part of my medical decision making, I reviewed the following data within the electronic MEDICAL RECORD NUMBER Nursing notes reviewed and  incorporated, Old chart reviewed, Notes from prior ED visits, and [place] Controlled Substance Database       *Ashlee Griffin was evaluated in Emergency Department on 06/05/2020 for the symptoms described in the history of present illness. She was evaluated in the context of the global COVID-19 pandemic, which necessitated consideration that the patient might be at risk for infection with the SARS-CoV-2 virus that causes COVID-19. Institutional protocols and algorithms that pertain to the evaluation of patients at risk for COVID-19 are in a state of rapid change based on information released by regulatory bodies including the CDC and federal and state organizations. These policies and algorithms were followed during the patient's care in the ED.  Some ED evaluations and interventions may be delayed as a result of limited staffing  during the pandemic.*     Medical Decision Making:  50 yo F here with atypical chest pain. EKG non ischemic and trop neg x 2 with HEART score<3, do not suspect CS. No LE swelling, tachypnea, tachycardia, pleurisy, or s/s DVT/PE. Sx improved with GI cocktail. No abd TTP or signs of cholecystitis, biliary colic on exam. CXR is clear. Labs reassuring with unremarkable CBC (minimal mild reactive leukocytosis), normal lytes (slight hyperglycemia, just ate). Will d/c with antacid therapy, return precautions.  Of note, pt hypertensive here. She has a h/o chronic HTN but states she no longer takes meds for this. Pain is not sharp, tearing. No signs of dissection. Will advise PCP f/u.   ____________________________________________  FINAL CLINICAL IMPRESSION(S) / ED DIAGNOSES  Final diagnoses:  Atypical chest pain     MEDICATIONS GIVEN DURING THIS VISIT:  Medications  alum & mag hydroxide-simeth (MAALOX/MYLANTA) 200-200-20 MG/5ML suspension 30 mL (30 mLs Oral Given 06/05/20 1947)    And  lidocaine (XYLOCAINE) 2 % viscous mouth solution 15 mL (15 mLs Oral Given 06/05/20  1947)     ED Discharge Orders    None       Note:  This document was prepared using Dragon voice recognition software and may include unintentional dictation errors.   Shaune Pollack, MD 06/05/20 2038    Shaune Pollack, MD 06/05/20 2043

## 2022-06-22 DIAGNOSIS — I10 Essential (primary) hypertension: Secondary | ICD-10-CM | POA: Diagnosis not present

## 2022-06-22 DIAGNOSIS — E785 Hyperlipidemia, unspecified: Secondary | ICD-10-CM | POA: Diagnosis not present

## 2022-06-22 DIAGNOSIS — Z0131 Encounter for examination of blood pressure with abnormal findings: Secondary | ICD-10-CM | POA: Diagnosis not present

## 2022-06-22 DIAGNOSIS — Z1389 Encounter for screening for other disorder: Secondary | ICD-10-CM | POA: Diagnosis not present

## 2022-06-22 DIAGNOSIS — R87810 Cervical high risk human papillomavirus (HPV) DNA test positive: Secondary | ICD-10-CM | POA: Diagnosis not present

## 2022-07-01 ENCOUNTER — Encounter: Payer: Self-pay | Admitting: Obstetrics & Gynecology

## 2022-07-07 ENCOUNTER — Encounter: Payer: Self-pay | Admitting: Nurse Practitioner

## 2022-07-08 ENCOUNTER — Other Ambulatory Visit: Payer: Self-pay

## 2022-07-08 DIAGNOSIS — Z1231 Encounter for screening mammogram for malignant neoplasm of breast: Secondary | ICD-10-CM

## 2022-07-18 ENCOUNTER — Ambulatory Visit: Payer: 59

## 2022-07-18 ENCOUNTER — Other Ambulatory Visit: Payer: Self-pay | Admitting: Family Medicine

## 2022-07-18 ENCOUNTER — Ambulatory Visit
Admission: RE | Admit: 2022-07-18 | Discharge: 2022-07-18 | Disposition: A | Discharge status: Home / Self Care | Payer: 59 | Source: Ambulatory Visit | Attending: Family Medicine | Admitting: Family Medicine

## 2022-07-18 DIAGNOSIS — Z1231 Encounter for screening mammogram for malignant neoplasm of breast: Secondary | ICD-10-CM | POA: Insufficient documentation

## 2022-07-21 ENCOUNTER — Other Ambulatory Visit: Payer: Self-pay | Admitting: Family Medicine

## 2022-07-21 DIAGNOSIS — N6489 Other specified disorders of breast: Secondary | ICD-10-CM

## 2022-07-21 DIAGNOSIS — R928 Other abnormal and inconclusive findings on diagnostic imaging of breast: Secondary | ICD-10-CM

## 2022-07-25 ENCOUNTER — Ambulatory Visit: Payer: Self-pay | Admitting: Obstetrics & Gynecology

## 2022-07-27 ENCOUNTER — Ambulatory Visit
Admission: RE | Admit: 2022-07-27 | Discharge: 2022-07-27 | Disposition: A | Discharge status: Home / Self Care | Payer: 59 | Source: Ambulatory Visit | Attending: Family Medicine | Admitting: Family Medicine

## 2022-07-27 DIAGNOSIS — R928 Other abnormal and inconclusive findings on diagnostic imaging of breast: Secondary | ICD-10-CM | POA: Diagnosis present

## 2022-07-27 DIAGNOSIS — R921 Mammographic calcification found on diagnostic imaging of breast: Secondary | ICD-10-CM | POA: Diagnosis not present

## 2022-07-27 DIAGNOSIS — N6489 Other specified disorders of breast: Secondary | ICD-10-CM

## 2022-09-28 ENCOUNTER — Ambulatory Visit (INDEPENDENT_AMBULATORY_CARE_PROVIDER_SITE_OTHER): Payer: 59 | Admitting: Obstetrics and Gynecology

## 2022-09-28 ENCOUNTER — Encounter: Payer: Self-pay | Admitting: Obstetrics and Gynecology

## 2022-09-28 ENCOUNTER — Other Ambulatory Visit (HOSPITAL_COMMUNITY)
Admission: RE | Admit: 2022-09-28 | Discharge: 2022-09-28 | Disposition: A | Discharge status: Home / Self Care | Payer: 59 | Source: Ambulatory Visit | Attending: Obstetrics and Gynecology | Admitting: Obstetrics and Gynecology

## 2022-09-28 VITALS — BP 188/94 | HR 72 | Resp 16 | Ht 60.0 in | Wt 136.6 lb

## 2022-09-28 DIAGNOSIS — N870 Mild cervical dysplasia: Secondary | ICD-10-CM | POA: Diagnosis not present

## 2022-09-28 DIAGNOSIS — D069 Carcinoma in situ of cervix, unspecified: Secondary | ICD-10-CM | POA: Diagnosis not present

## 2022-09-28 DIAGNOSIS — D060 Carcinoma in situ of endocervix: Secondary | ICD-10-CM | POA: Diagnosis not present

## 2022-09-28 DIAGNOSIS — R87611 Atypical squamous cells cannot exclude high grade squamous intraepithelial lesion on cytologic smear of cervix (ASC-H): Secondary | ICD-10-CM

## 2022-09-28 NOTE — Patient Instructions (Signed)
Colposcopy  Colposcopy is a procedure to examine the lowest part of the uterus (cervix) for abnormalities or signs of disease. This procedure is done using an instrument that makes objects appear larger and provides light (colposcope). During the procedure, your health care provider may remove a tissue sample to look at under a microscope (biopsy). A biopsy may be done if any unusual cells are seen during the colposcopy. You may have a colposcopy if you have: An abnormal Pap smear, also called a Pap test. This screening test is used to check for signs of cancer or infection of the vagina, cervix, and uterus. An HPV (human papillomavirus) test and get a positive result for a type of HPV that puts you at high risk of cancer. Certain conditions or symptoms, such as: A sore, or lesion, on your cervix. Genital warts on your vulva, vagina, or cervix. Pain during sex. Vaginal bleeding, especially after sex. A growth on your cervix (cervical polyp) that needs to be removed. Let your health care provider know about: Any allergies you have, including allergies to medicines, latex, or iodine. All medicines you are taking, including vitamins, herbs, eye drops, creams, and over-the-counter medicines. Any bleeding problems you have. Any surgeries you have had. Any medical conditions you have, such as pelvic inflammatory disease (PID) or an endometrial disorder. The pattern of your menstrual cycles and the form of birth control (contraception) you use, if any. Your medical history, including any cervical treatments and how well you tolerated the procedure (if you have ever fainted). Whether you are pregnant or may be pregnant. What are the risks? Generally, this is a safe procedure. However, problems may occur, including: Infection. Symptoms of infection may include fever, bad-smelling vaginal discharge, or pelvic pain. Vaginal bleeding. Allergic reactions to medicines. Damage to nearby structures or  organs. What happens before the procedure? Medicines Ask your health care provider about: Changing or stopping your regular medicines. This is especially important if you are taking diabetes medicines or blood thinners. Taking medicines such as aspirin and ibuprofen. These medicines can thin your blood. Do not take these medicines unless your health care provider tells you to take them. Your health care provider will likely tell you to avoid taking aspirin, or medicine that contains aspirin, for 7 days before the procedure. Taking over-the-counter medicines, vitamins, herbs, and supplements. General instructions Tell your health care provider if you have your menstrual period now or will have it at the time of your procedure. A colposcopy is not normally done during your menstrual period. If you use contraception, continue to use it before your procedure. For 24 hours before the procedure: Do not use douche products or tampons. Do not use medicines, creams, or suppositories in the vagina. Do not have sex or insert anything into your vagina. Ask your health care provider what steps will be taken to prevent infection. What happens during the procedure? You will lie down on your back, with your feet in foot rests (stirrups). An instrument called a speculum will be inserted into your vagina. This will be used so your health care provider can see your cervix and the inside of your vagina. A cotton swab will be used to place a small amount of a liquid (solution) on the areas to be examined. This solution makes it easier to see abnormal cells. You may feel a slight burning during this part. The colposcope will be used to scan the cervix with a bright white light. The colposcope will be held near   your vulva and will make your vulva, vagina, and cervix look bigger so they can be seen better. If a biopsy is needed: You may be given a medicine to numb the area (local anesthetic). Surgical tools will be  used to remove mucus and cells through your vagina. You may feel mild pain while the tissue sample is removed. Bleeding may occur. A solution may be used to stop the bleeding. The tissue removed will be sent to a lab to be looked at under a microscope. The procedure may vary among health care providers and hospitals. What happens after the procedure? You may have some cramping in your abdomen. This should go away after a few minutes. It is up to you to get the results of your procedure. Ask your health care provider, or the department that is doing the procedure, when your results will be ready. Summary Colposcopy is a procedure to examine the lowest part of the uterus (cervix), for signs of disease. A biopsy may be done as part of the procedure. You may have some cramping in your abdomen. This should go away after a few minutes. It is up to you to get the results of your procedure. Ask your health care provider, or the department that is doing the procedure, when your results will be ready. This information is not intended to replace advice given to you by your health care provider. Make sure you discuss any questions you have with your health care provider. Document Revised: 08/09/2020 Document Reviewed: 08/09/2020 Elsevier Patient Education  2024 [employer]  

## 2022-09-28 NOTE — Progress Notes (Signed)
     GYNECOLOGY OFFICE COLPOSCOPY PROCEDURE NOTE  52 y.o. No obstetric history on file. here for colposcopy for ASC cannot exclude high grade lesion [facility]) pap smear on 12/28/2021. Discussed role for HPV in cervical dysplasia, need for surveillance.  Patient gave informed written consent, time out was performed.  Placed in lithotomy position. Cervix viewed with speculum and colposcope after application of acetic acid.   Colposcopy adequate? Yes  acetowhite lesion(s) noted at 12 and 8 o'clock; corresponding biopsies obtained.  ECC specimen obtained. All specimens were labeled and sent to pathology.  Chaperone was present during entire procedure.  Patient was given post procedure instructions.  Will follow up pathology and manage accordingly; patient will be contacted with results and recommendations.  Routine preventative health maintenance measures emphasized.    Hildred Laser, MD [facility]

## 2022-10-05 ENCOUNTER — Encounter: Payer: Self-pay | Admitting: Obstetrics and Gynecology

## 2022-10-05 LAB — SURGICAL PATHOLOGY

## 2022-12-28 ENCOUNTER — Encounter: Payer: Self-pay | Admitting: Family Medicine

## 2023-01-04 ENCOUNTER — Other Ambulatory Visit: Payer: Self-pay | Admitting: Family Medicine

## 2023-01-04 DIAGNOSIS — R921 Mammographic calcification found on diagnostic imaging of breast: Secondary | ICD-10-CM

## 2023-01-04 DIAGNOSIS — N6489 Other specified disorders of breast: Secondary | ICD-10-CM

## 2023-01-30 ENCOUNTER — Ambulatory Visit
Admission: RE | Admit: 2023-01-30 | Discharge: 2023-01-30 | Disposition: A | Discharge status: Home / Self Care | Payer: 59 | Source: Ambulatory Visit | Attending: Family Medicine | Admitting: Family Medicine

## 2023-01-30 DIAGNOSIS — N6489 Other specified disorders of breast: Secondary | ICD-10-CM

## 2023-01-30 DIAGNOSIS — R921 Mammographic calcification found on diagnostic imaging of breast: Secondary | ICD-10-CM | POA: Insufficient documentation

## 2023-01-30 DIAGNOSIS — R92321 Mammographic fibroglandular density, right breast: Secondary | ICD-10-CM | POA: Diagnosis not present

## 2023-01-30 DIAGNOSIS — R928 Other abnormal and inconclusive findings on diagnostic imaging of breast: Secondary | ICD-10-CM | POA: Diagnosis not present

## 2023-07-05 ENCOUNTER — Encounter: Payer: Self-pay | Admitting: Family Medicine

## 2023-07-12 ENCOUNTER — Other Ambulatory Visit: Payer: Self-pay | Admitting: Nurse Practitioner

## 2023-07-12 DIAGNOSIS — N6489 Other specified disorders of breast: Secondary | ICD-10-CM

## 2023-07-12 DIAGNOSIS — R921 Mammographic calcification found on diagnostic imaging of breast: Secondary | ICD-10-CM

## 2023-07-18 ENCOUNTER — Encounter

## 2023-07-18 ENCOUNTER — Other Ambulatory Visit
